# Patient Record
Sex: Female | Born: 1960
Health system: Southern US, Community
[De-identification: ages and names within clinical notes are randomized; demographics above are authoritative.]

## PROBLEM LIST (undated history)

## (undated) DIAGNOSIS — R87629 Unspecified abnormal cytological findings in specimens from vagina: Secondary | ICD-10-CM

## (undated) DIAGNOSIS — R928 Other abnormal and inconclusive findings on diagnostic imaging of breast: Secondary | ICD-10-CM

## (undated) DIAGNOSIS — H9190 Unspecified hearing loss, unspecified ear: Secondary | ICD-10-CM

## (undated) HISTORY — DX: Unspecified hearing loss, unspecified ear: H91.90

## (undated) HISTORY — DX: Other abnormal and inconclusive findings on diagnostic imaging of breast: R92.8

## (undated) HISTORY — PX: INNER EAR SURGERY: SHX679

## (undated) HISTORY — PX: TONSILLECTOMY: SUR1361

## (undated) HISTORY — DX: Unspecified abnormal cytological findings in specimens from vagina: R87.629

---

## 1998-11-18 ENCOUNTER — Other Ambulatory Visit: Admission: RE | Admit: 1998-11-18 | Discharge: 1998-11-18 | Payer: Self-pay | Admitting: *Deleted

## 1999-09-08 ENCOUNTER — Encounter: Payer: Self-pay | Admitting: *Deleted

## 1999-09-08 ENCOUNTER — Ambulatory Visit (HOSPITAL_COMMUNITY): Admission: RE | Admit: 1999-09-08 | Discharge: 1999-09-08 | Payer: Self-pay | Admitting: *Deleted

## 2000-09-21 ENCOUNTER — Encounter: Payer: Self-pay | Admitting: *Deleted

## 2000-09-21 ENCOUNTER — Ambulatory Visit (HOSPITAL_COMMUNITY): Admission: RE | Admit: 2000-09-21 | Discharge: 2000-09-21 | Payer: Self-pay | Admitting: *Deleted

## 2002-03-09 ENCOUNTER — Encounter: Admission: RE | Admit: 2002-03-09 | Discharge: 2002-03-09 | Payer: Self-pay | Admitting: Family Medicine

## 2002-03-16 ENCOUNTER — Encounter: Admission: RE | Admit: 2002-03-16 | Discharge: 2002-03-16 | Payer: Self-pay | Admitting: Family Medicine

## 2002-04-03 ENCOUNTER — Encounter: Payer: Self-pay | Admitting: *Deleted

## 2002-04-03 ENCOUNTER — Ambulatory Visit (HOSPITAL_COMMUNITY): Admission: RE | Admit: 2002-04-03 | Discharge: 2002-04-03 | Payer: Self-pay | Admitting: *Deleted

## 2003-05-22 ENCOUNTER — Ambulatory Visit (HOSPITAL_COMMUNITY): Admission: RE | Admit: 2003-05-22 | Discharge: 2003-05-22 | Payer: Self-pay | Admitting: *Deleted

## 2004-11-25 ENCOUNTER — Ambulatory Visit (HOSPITAL_COMMUNITY): Admission: RE | Admit: 2004-11-25 | Discharge: 2004-11-25 | Payer: Self-pay

## 2005-07-02 ENCOUNTER — Encounter (INDEPENDENT_AMBULATORY_CARE_PROVIDER_SITE_OTHER): Payer: Self-pay | Admitting: *Deleted

## 2005-07-17 ENCOUNTER — Ambulatory Visit: Payer: Self-pay | Admitting: Sports Medicine

## 2005-07-28 ENCOUNTER — Ambulatory Visit: Payer: Self-pay | Admitting: Sports Medicine

## 2006-01-14 ENCOUNTER — Ambulatory Visit (HOSPITAL_COMMUNITY): Admission: RE | Admit: 2006-01-14 | Discharge: 2006-01-14 | Payer: Self-pay | Admitting: Family Medicine

## 2006-07-01 DIAGNOSIS — F17200 Nicotine dependence, unspecified, uncomplicated: Secondary | ICD-10-CM

## 2006-07-01 DIAGNOSIS — F411 Generalized anxiety disorder: Secondary | ICD-10-CM | POA: Insufficient documentation

## 2006-07-02 ENCOUNTER — Encounter (INDEPENDENT_AMBULATORY_CARE_PROVIDER_SITE_OTHER): Payer: Self-pay | Admitting: *Deleted

## 2007-04-20 ENCOUNTER — Encounter: Payer: Self-pay | Admitting: *Deleted

## 2009-02-21 ENCOUNTER — Encounter: Payer: Self-pay | Admitting: Family Medicine

## 2009-02-21 ENCOUNTER — Ambulatory Visit: Payer: Self-pay | Admitting: Family Medicine

## 2009-02-21 DIAGNOSIS — M7120 Synovial cyst of popliteal space [Baker], unspecified knee: Secondary | ICD-10-CM | POA: Insufficient documentation

## 2009-02-21 DIAGNOSIS — N951 Menopausal and female climacteric states: Secondary | ICD-10-CM

## 2009-02-21 DIAGNOSIS — K429 Umbilical hernia without obstruction or gangrene: Secondary | ICD-10-CM | POA: Insufficient documentation

## 2009-02-21 LAB — CONVERTED CEMR LAB
BUN: 15 mg/dL (ref 6–23)
CO2: 24 meq/L (ref 19–32)
Calcium: 10.3 mg/dL (ref 8.4–10.5)
Chlamydia, DNA Probe: NEGATIVE
Chloride: 102 meq/L (ref 96–112)
Cholesterol: 217 mg/dL — ABNORMAL HIGH (ref 0–200)
Creatinine, Ser: 0.73 mg/dL (ref 0.40–1.20)
GC Probe Amp, Genital: NEGATIVE
Glucose, Bld: 103 mg/dL — ABNORMAL HIGH (ref 70–99)
HDL: 99 mg/dL (ref >39)
LDL Cholesterol: 96 mg/dL (ref 0–99)
Potassium: 4 meq/L (ref 3.5–5.3)
Sodium: 140 meq/L (ref 135–145)
Total CHOL/HDL Ratio: 2.2
Triglycerides: 109 mg/dL (ref <150)
VLDL: 22 mg/dL (ref 0–40)
Whiff Test: NEGATIVE

## 2009-03-01 ENCOUNTER — Telehealth: Payer: Self-pay | Admitting: Family Medicine

## 2009-03-03 ENCOUNTER — Encounter: Payer: Self-pay | Admitting: Family Medicine

## 2009-03-05 ENCOUNTER — Ambulatory Visit: Payer: Self-pay | Admitting: Family Medicine

## 2009-03-19 ENCOUNTER — Ambulatory Visit: Payer: Self-pay | Admitting: Psychology

## 2009-06-11 ENCOUNTER — Telehealth (INDEPENDENT_AMBULATORY_CARE_PROVIDER_SITE_OTHER): Payer: Self-pay | Admitting: *Deleted

## 2009-07-02 ENCOUNTER — Ambulatory Visit: Payer: Self-pay | Admitting: Family Medicine

## 2009-07-02 DIAGNOSIS — I10 Essential (primary) hypertension: Secondary | ICD-10-CM | POA: Insufficient documentation

## 2009-07-02 LAB — CONVERTED CEMR LAB: Hgb A1c MFr Bld: 5.7 %

## 2009-07-19 ENCOUNTER — Ambulatory Visit: Payer: Self-pay | Admitting: Family Medicine

## 2009-07-19 DIAGNOSIS — N390 Urinary tract infection, site not specified: Secondary | ICD-10-CM

## 2009-07-19 LAB — CONVERTED CEMR LAB
Nitrite: NEGATIVE
Specific Gravity, Urine: <1.005
pH: 6

## 2010-06-03 NOTE — Assessment & Plan Note (Signed)
Summary: uti per pt/neal/eo   Vital Signs:  Patient profile:   50 year old female Height:      66 inches Weight:      129 pounds BMI:     20.90 BSA:     1.66 Temp:     98.5 degrees F Pulse rate:   86 / minute BP sitting:   129 / 85  Vitals Entered By: Jone Baseman CMA (July 19, 2009 3:53 PM) CC: ? UTI Is Patient Diabetic? No Pain Assessment Patient in pain? no        Primary Care Provider:  Denny Levy MD  CC:  ? UTI.  History of Present Illness: UTI symptoms: started yesterday.  came in early because got very sick in past with UTI.  noted dysuria particularly at end of urinating.  also frequency and urgency. no blood.  no flank pain.  no fevers.   Habits & Providers  Alcohol-Tobacco-Diet     Tobacco Status: current     Tobacco Counseling: to quit use of tobacco products     Cigarette Packs/Day: 1.0  Current Medications (verified): 1)  Multivitamins  Tabs (Multiple Vitamin) .... One By Mouth Daily 2)  Lisinopril 20 Mg Tabs (Lisinopril) .Marland Kitchen.. 1 Tablet By Mouth Daily 3)  Wellbutrin 100 Mg Tabs (Bupropion Hcl) .... One By Mouth Two Times A Day X 3 Days, Then One By Mouth Three Times A Day 4)  Cephalexin 500 Mg Caps (Cephalexin) .Marland Kitchen.. 1 By Mouth Two Times A Day For 7 Days.  Allergies (verified): 1)  ! Sulfa  Review of Systems       per HPI  Physical Exam  General:  Well-developed,well-nourished,in no acute distress; alert,appropriate and cooperative throughout examination VS noted Abdomen:  soft, nontender, nondistended.  no CVA tenderness.     Impression & Recommendations:  Problem # 1:  UTI (ICD-599.0) Assessment New  UA findings + symptoms.  will rx with keflex.  told to return if not improving in 5 days of if develops fever/chills/flank pain.    Her updated medication list for this problem includes:    Cephalexin 500 Mg Caps (Cephalexin) .Marland Kitchen... 1 by mouth two times a day for 7 days.  Orders: [facility]- Est Level  3 ([address])  Complete Medication  List: 1)  Multivitamins Tabs (Multiple vitamin) .... One by mouth daily 2)  Lisinopril 20 Mg Tabs (Lisinopril) .Marland Kitchen.. 1 tablet by mouth daily 3)  Wellbutrin 100 Mg Tabs (Bupropion hcl) .... One by mouth two times a day x 3 days, then one by mouth three times a day 4)  Cephalexin 500 Mg Caps (Cephalexin) .Marland Kitchen.. 1 by mouth two times a day for 7 days.  Other Orders: Urinalysis-[facility] (00000) Prescriptions: CEPHALEXIN 500 MG CAPS (CEPHALEXIN) 1 by mouth two times a day for 7 days.  #14 x 0   Entered and Authorized by:   Ancil Boozer  MD   Signed by:   Ancil Boozer  MD on 07/19/2009   Method used:   Print then Give to Patient   RxID:   [id number]   Laboratory Results   Urine Tests  Date/Time Received: July 19, 2009 3:42 PM  Date/Time Reported: July 19, 2009 4:20 PM   Routine Urinalysis   Color: yellow Appearance: Clear Glucose: negative   (Normal Range: Negative) Bilirubin: negative   (Normal Range: Negative) Ketone: negative   (Normal Range: Negative) Spec. Gravity: <1.005   (Normal Range: 1.003-1.035) Blood: small   (Normal Range: Negative) pH: 6.0   (Normal  Range: 5.0-8.0) Protein: negative   (Normal Range: Negative) Urobilinogen: 0.2   (Normal Range: 0-1) Nitrite: negative   (Normal Range: Negative) Leukocyte Esterace: trace   (Normal Range: Negative)  Urine Microscopic WBC/HPF: 0-3 RBC/HPF: 0-2 Bacteria/HPF: 2+ Epithelial/HPF: 0-3    Comments: ...........test performed by...........Marland KitchenTerese Door, CMA       Prevention & Chronic Care Immunizations   Influenza vaccine: Not documented    Tetanus booster: Not documented    Pneumococcal vaccine: Not documented  Colorectal Screening   Hemoccult: Not documented   Hemoccult action/deferral: Not indicated  (07/02/2009)    Colonoscopy: Not documented  Other Screening   Pap smear: NEGATIVE FOR INTRAEPITHELIAL LESIONS OR MALIGNANCY.  (02/21/2009)    Mammogram: Done.  (01/02/2006)   Smoking status:  current  (07/19/2009)   Smoking cessation counseling: yes  (02/21/2009)  Lipids   Total Cholesterol: 217  (02/21/2009)   LDL: 96  (02/21/2009)   LDL Direct: Not documented   HDL: 99  (02/21/2009)   Triglycerides: 109  (02/21/2009)  Hypertension   Last Blood Pressure: 129 / 85  (07/19/2009)   Serum creatinine: 0.73  (02/21/2009)   Serum potassium 4.0  (02/21/2009)    Hypertension flowsheet reviewed?: Yes   Progress toward BP goal: At goal  Self-Management Support :   Personal Goals (by the next clinic visit) :      Personal blood pressure goal: 140/90  (07/02/2009)   Hypertension self-management support: Written self-care plan  (07/02/2009)

## 2010-06-03 NOTE — Assessment & Plan Note (Signed)
Summary: bp ck,tcb  Nurse Visit    patient in office today for BP check. she states she found out the name of the ear drops that were prescribed for her in the past, Floxin ear drops. she was told MD would prescribe . [facility]. also reports left ear is draining. she is allergic to sulfa.     BP check today. 148/92 right arm and 140/96 left arm. pulse 88. she has checked BP at home once and reading was 133/102. phone number to call back [phone]. will forward message to MD. Theresia Lo RN  March 05, 2009 10:54 AM  Please call patient to let her know that I am prescribing 2 medications. (1) the ear drop, and (2) something for her BP. I would like for her to make a follow up visit in 2-4 weeks to check her blood pressure and get an A1c. Also, ask her to continue to check her BP at home and remind her to sign up for the FREE smoking cessation class. Thanks.   Prescriptions: Prescriptions: OFLOXACIN 0.3 % SOLN (OFLOXACIN) 10 gtt in ear(s) q d x 14 days  #1 qs x 0   Entered and Authorized by:   Helane Rima DO   Signed by:   Helane Rima DO on 03/05/2009   Method used:   Electronically to        [facility] # [phone]* (retail)       [address]       [place], [place]  [address]       Ph: [phone]       Fax: [phone]   RxID:   [id number] LISINOPRIL 20 MG TABS (LISINOPRIL) 1 tablet by mouth daily  #90 x 3   Entered and Authorized by:   Helane Rima DO   Signed by:   Helane Rima DO on 03/05/2009   Method used:   Electronically to        [facility] # [phone]* (retail)       [address]       [place], [place]  [address]       Ph: [phone]       Fax: [phone]   RxID:   [id number]   Appended Document: bp ck,tcb patient notified of above message .

## 2010-06-03 NOTE — Progress Notes (Signed)
Summary: phn msg  Phone Note Call from Patient Call back at [facility] Phone [phone]   Caller: Patient Summary of Call: Pt needs to know if she needs to have any labs done soon? Initial call taken by: Clydell Hakim,  June 11, 2009 12:16 PM  Follow-up for Phone Call        to PCP Follow-up by: Gladstone Pih,  June 11, 2009 2:42 PM  Additional Follow-up for Phone Call Additional follow up Details #1::        will send to Dr Earlene Plater as Fredia Beets never seen her  Denny Levy MD  June 11, 2009 2:44 PM   New Problems: HYPERGLYCEMIA, FASTING (ICD-790.29)   Additional Follow-up for Phone Call Additional follow up Details #2::    She needs an A1c that has already been ordered. Please check her BP when she comes in or just have her make this an office visit. Follow-up by: Helane Rima DO,  June 12, 2009 7:23 AM  Additional Follow-up for Phone Call Additional follow up Details #3:: Details for Additional Follow-up Action Taken: Pt informed and agreeablet o making appt. Additional Follow-up by: Jone Baseman CMA,  June 12, 2009 4:40 PM  New Problems: HYPERGLYCEMIA, FASTING (ICD-790.29)  Appended Document: phn msg Pt made appt for 07/02/09 with Dr. Earlene Plater.

## 2010-06-03 NOTE — Letter (Signed)
Summary: Results Letter  [facility]  [address]   [place], [place] [address]   Phone: [phone]  Fax: [phone]    03/03/2009  Michelle Nielsen [address] [place], [place]  [address]  Dear Michelle Nielsen:  I'm happy to inform you that the results of your HIV, gonorrhea, and chlamydia tests were all negative. Your kidney and electrolytes were also normal. Your cholesterol was slightly elevated at 217 (see results below). I have provided some diet tips to help bring this number down.   Your glucose was also slightly elevated. This could be high if you had something to eat or drink before the labs were drawn, but it may be an indicator of early diabetes. We can check this again next time you come in.   We have carefully reviewed your last lipid profile from 02/21/2009 and the results are noted below with a summary of recommendations for lipid management.    Cholesterol:       217     Goal: < 200   HDL "good" Cholesterol:   99     Goal: > 39   LDL "bad" Cholesterol:   96     Goal: < 99   Triglycerides:       109     Goal: < 150    TLC Diet (Therapeutic Lifestyle Change): Saturated Fats < 7% of total calories ***Reduce Saturated Fats Polyunstaurated Fat can be up to 10% of total calories Monounsaturated Fat Fat can be up to 20% of total calories Total Fat should be no greater than 25-35% of total calories Carbohydrates should be 50-60% of total calories Protein should be approximately 15% of total calories Fiber should be at least 20-30 grams a day ***Increased fiber may help lower LDL Total Cholesterol should be < 200mg /day Consider adding plant stanol/sterols to diet (example: Benacol spread) ***A higher intake of unsaturated fat may reduce Triglycerides and Increase HDL    Adjunctive Measures (may lower LIPIDS and reduce risk of Heart Attack) include: Aerobic Exercise (20-30 minutes 3-4 times a week) Limit Alcohol Consumption Weight Reduction Aspirin 75-81 mg a  day by mouth (if not allergic or contraindicated) Dietary Fiber 20-30 grams a day by mouth     Current Medications: 1)    Multivitamins  Tabs (Multiple vitamin) .... One by mouth daily  If you have any questions, please call. We appreciate being able to work with you.   Sincerely,    [facility] Helane Rima DO  Appended Document: Results Letter mailed.
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
## 2010-06-03 NOTE — Progress Notes (Signed)
Summary: Lab Res  Phone Note Call from Patient   Caller: Patient Summary of Call: pt checking on results of all labs from physical. Initial call taken by: Clydell Hakim,  March 01, 2009 3:04 PM  Follow-up for Phone Call        will forward to MD. Follow-up by: Theresia Lo RN,  March 01, 2009 6:42 PM  Additional Follow-up for Phone Call Additional follow up Details #1::        letter complete. please call patient with results. Additional Follow-up by: Helane Rima DO,  March 03, 2009 3:09 PM     Appended Document: Lab Res also, please ask the patient if she has kept a record of her blood pressure readings. if she has not, please have her come in for a nurse visit, bp check. thanks.

## 2010-06-03 NOTE — Assessment & Plan Note (Signed)
Summary: hyperglycemia, HTN, tobacco abuse   Vital Signs:  Patient profile:   50 year old female Height:      66 inches Weight:      128.6 pounds BMI:     20.83 Temp:     98.2 degrees F oral Pulse rate:   88 / minute BP sitting:   116 / 75  (left arm) Cuff size:   regular  Vitals Entered By: Gladstone Pih (July 02, 2009 4:40 PM) CC: Hyperglycemia, HTN, Tobacco Abuse Is Patient Diabetic? Yes Did you bring your meter with you today? Yes Pain Assessment Patient in pain? no        Primary Care Provider:  Denny Levy MD  CC:  Hyperglycemia, HTN, and Tobacco Abuse.  History of Present Illness:  1. Fasting Hyperglycemia: On recent BMP. denies polyuria, polydipsia, weight change, Fam Hx DM.  2. HTN: Rx Lisinopril. No issues with the medication.   3. Tobaco Abuse: Went to Smoking Cessation Class with Dr. Pascal Lux. Wants to try Wellbutrin.  Habits & Providers  Alcohol-Tobacco-Diet     Tobacco Status: current     Tobacco Counseling: to quit use of tobacco products     Cigarette Packs/Day: 1.0  Current Medications (verified): 1)  Multivitamins  Tabs (Multiple Vitamin) .... One By Mouth Daily 2)  Lisinopril 20 Mg Tabs (Lisinopril) .Marland Kitchen.. 1 Tablet By Mouth Daily 3)  Wellbutrin 100 Mg Tabs (Bupropion Hcl) .... One By Mouth Two Times A Day X 3 Days, Then One By Mouth Three Times A Day  Allergies (verified): 1)  ! 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 CV:  Denies chest pain or discomfort, shortness of breath with exertion, swelling of feet, and swelling of hands. Resp:  Denies shortness of breath. Endo:  Denies  excessive hunger, excessive thirst, excessive urination, and weight change.  Physical Exam  General:  Well-developed, well-nourished, in no acute distress; alert, appropriate and cooperative throughout examination. Vitals reviewed. Lungs:  Normal respiratory effort, chest expands symmetrically. Lungs are clear to auscultation, no crackles or wheezes. Heart:  Normal rate and regular rhythm. S1 and S2 normal without gallop, murmur, click, rub or other extra sounds. Pulses:  R and L dorsalis pedis, and posterior tibial pulses are full and equal bilaterally. Extremities:  No clubbing, cyanosis, edema, or deformity noted. Psych:  Oriented X3, memory intact for recent and remote, normally interactive, and slightly anxious.     Impression & Recommendations:  Problem # 1:  HYPERGLYCEMIA, FASTING (ICD-790.29) Assessment Unchanged A1c WNL. Orders: A1C-[facility] (45409) [facility]- Est  Level 4 (81191)  Problem # 2:  ESSENTIAL HYPERTENSION (ICD-401.9) Assessment: Improved Controlled on current medication. Will need follow up BMP to monitor creatinine. Her updated medication list for this problem includes:    Lisinopril 20 Mg Tabs (Lisinopril) .Marland Kitchen... 1 tablet by mouth daily  Orders: [facility]- Est  Level 4 (47829)  Problem # 3:  TOBACCO DEPENDENCE (ICD-305.1) Assessment: Unchanged Rx Wellbutrin. Patient will call to look into continuing with the free class. Orders: [facility]- Est  Level 4 (56213)  Complete Medication List: 1)  Multivitamins Tabs (Multiple vitamin) .... One by mouth daily 2)  Lisinopril 20 Mg Tabs (Lisinopril) .Marland Kitchen.. 1 tablet by mouth daily 3)  Wellbutrin 100 Mg Tabs (Bupropion hcl) .... One by mouth two times a day x 3 days, then one by mouth three times a day  Patient Instructions: 1)  It was nice to see you today! 2)  Start Wellbutrin for your depression and to help with smoking cessation.  3)  Follow up in 1 month or sooner if you need me. We will check labs when you come back. 4)  You are  due for a screening mammogram and colonoscopy. Prescriptions: LISINOPRIL 20 MG TABS (LISINOPRIL) 1 tablet by mouth daily  #90 x 3   Entered and Authorized by:   Helane Rima DO   Signed by:   Helane Rima DO on 07/02/2009   Method used:   Print then Give to Patient   RxID:   [id number] WELLBUTRIN 100 MG TABS (BUPROPION HCL) one by mouth two times a day x 3 days, then one by mouth three times a day  #90 x 3   Entered and Authorized by:   Helane Rima DO   Signed by:   Helane Rima DO on 07/02/2009   Method used:   Print then Give to Patient   RxID:   [id number]   Laboratory Results   Blood Tests   Date/Time Received: July 02, 2009 4:48 PM  Date/Time Reported: July 02, 2009 4:59 PM   HGBA1C: 5.7%   (Normal Range: Non-Diabetic - 3-6%   Control Diabetic - 6-8%)  Comments: ...............test performed by......Marland KitchenBonnie A. [place], MLS (ASCP)cm      Prevention & Chronic Care Immunizations   Influenza vaccine: Not documented    Tetanus booster: Not documented    Pneumococcal vaccine: Not documented  Colorectal Screening   Hemoccult: Not documented   Hemoccult action/deferral: Not indicated  (07/02/2009)    Colonoscopy: Not documented  Other Screening   Pap smear: NEGATIVE FOR INTRAEPITHELIAL LESIONS OR MALIGNANCY.  (02/21/2009)    Mammogram: Done.  (01/02/2006)   Smoking status: current  (07/02/2009)   Smoking cessation counseling: yes  (02/21/2009)  Lipids   Total Cholesterol: 217  (02/21/2009)   LDL: 96  (02/21/2009)   LDL Direct: Not documented   HDL: 99  (02/21/2009)   Triglycerides: 109  (02/21/2009)  Hypertension   Last Blood Pressure: 116 / 75  (07/02/2009)   Serum creatinine: 0.73  (02/21/2009)   Serum potassium 4.0  (02/21/2009)    Hypertension flowsheet reviewed?: Yes   Progress toward BP goal: At goal  Self-Management Support :   Personal Goals (by the next clinic visit) :      Personal blood pressure goal: 140/90   (07/02/2009)   Patient will work on the following items until the next clinic visit to reach self-care goals:     Medications and monitoring: take my medicines every day, bring all of my medications to every visit  (07/02/2009)     Eating: drink diet soda or water instead of juice or soda, eat more vegetables, use fresh or frozen vegetables, eat foods that are low in salt, eat baked foods instead of fried foods, eat fruit for snacks and desserts, limit or avoid alcohol  (07/02/2009)     Activity: take a 30 minute walk every day, take the stairs instead of the elevator, park at the far end of the parking lot  (07/02/2009)    Hypertension self-management support: Written self-care plan  (07/02/2009)   Hypertension self-care plan printed.

## 2010-06-12 ENCOUNTER — Encounter: Payer: Self-pay | Admitting: *Deleted

## 2010-08-04 ENCOUNTER — Encounter: Payer: Self-pay | Admitting: Family Medicine

## 2015-01-03 ENCOUNTER — Emergency Department
Admission: EM | Admit: 2015-01-03 | Discharge: 2015-01-03 | Disposition: A | Discharge status: Home / Self Care | Payer: Self-pay | Attending: Emergency Medicine | Admitting: Emergency Medicine

## 2015-01-03 ENCOUNTER — Encounter: Payer: Self-pay | Admitting: *Deleted

## 2015-01-03 DIAGNOSIS — I10 Essential (primary) hypertension: Secondary | ICD-10-CM | POA: Insufficient documentation

## 2015-01-03 DIAGNOSIS — Z79811 Long term (current) use of aromatase inhibitors: Secondary | ICD-10-CM | POA: Insufficient documentation

## 2015-01-03 DIAGNOSIS — Z792 Long term (current) use of antibiotics: Secondary | ICD-10-CM | POA: Insufficient documentation

## 2015-01-03 DIAGNOSIS — Z720 Tobacco use: Secondary | ICD-10-CM | POA: Insufficient documentation

## 2015-01-03 DIAGNOSIS — Z79899 Other long term (current) drug therapy: Secondary | ICD-10-CM | POA: Insufficient documentation

## 2015-01-03 DIAGNOSIS — F1010 Alcohol abuse, uncomplicated: Secondary | ICD-10-CM | POA: Insufficient documentation

## 2015-01-03 DIAGNOSIS — R000 Tachycardia, unspecified: Secondary | ICD-10-CM | POA: Insufficient documentation

## 2015-01-03 NOTE — ED Provider Notes (Signed)
[facility] Emergency Department Provider Note  ____________________________________________  Time seen: 10:30 PM  I have reviewed the triage vital signs and the nursing notes.   HISTORY  Chief Complaint Alcohol Problem    HPI Michelle Nielsen is a 54 y.o. female who presents for medical screening exam prior to entering a alcohol detox and rehabilitation program. He states that she drinks wine 2-3 times a week, and when she does she drinks "a lot". Denies any withdrawal symptoms such as anxiousness flushing or tremor. No history of hallucinosis or seizure. No recent trauma. No other acute symptoms.     History reviewed. No pertinent past medical history.   Patient Active Problem List   Diagnosis Date Noted  . UTI 07/19/2009  . ESSENTIAL HYPERTENSION 07/02/2009  . UMBILICAL HERNIA 88/91/6945  . MENOPAUSE-RELATED VASOMOTOR SYMPTOMS, HOT FLASHES 02/21/2009  . BAKER'S CYST, RIGHT KNEE 02/21/2009  . ANXIETY 07/01/2006  . TOBACCO DEPENDENCE 07/01/2006     History reviewed. No pertinent past surgical history.   Current Outpatient Rx  Name  Route  Sig  Dispense  Refill  . buPROPion (WELLBUTRIN) 100 MG tablet   Oral   Take 100 mg by mouth 3 (three) times daily.           . cephALEXin (KEFLEX) 500 MG capsule   Oral   Take 500 mg by mouth 2 (two) times daily. X 7 days          . lisinopril (PRINIVIL,ZESTRIL) 20 MG tablet   Oral   Take 20 mg by mouth daily.           . Multiple Vitamin (MULTIVITAMIN) tablet   Oral   Take 1 tablet by mouth daily.              Allergies Sulfonamide derivatives   No family history on file.  Social History Social History  Substance Use Topics  . Smoking status: Current Every Day Smoker  . Smokeless tobacco: None  . Alcohol Use: Yes    Review of Systems  Constitutional:   No fever or chills. No weight changes Eyes:   No blurry vision or double vision.  ENT:   No sore throat. Cardiovascular:    No chest pain. Respiratory:   No dyspnea or cough. Gastrointestinal:   Negative for abdominal pain, vomiting and diarrhea.  No BRBPR or melena. Genitourinary:   Negative for dysuria, urinary retention, bloody urine, or difficulty urinating. Musculoskeletal:   Negative for back pain. No joint swelling or pain. Skin:   Negative for rash. Neurological:   Negative for headaches, focal weakness or numbness. Psychiatric:  No anxiety or depression.   Endocrine:  No hot/cold intolerance, changes in energy, or sleep difficulty.  10-point ROS otherwise negative.  ____________________________________________   PHYSICAL EXAM:  VITAL SIGNS: ED Triage Vitals  Enc Vitals Group     BP 01/03/15 2154 125/92 mmHg     Pulse Rate 01/03/15 2154 109     Resp 01/03/15 2154 20     Temp 01/03/15 2154 98.3 F (36.8 C)     Temp Source 01/03/15 2154 Oral     SpO2 01/03/15 2154 99 %     Weight 01/03/15 2154 130 lb (58.968 kg)     Height 01/03/15 2154 5\' 7"  (1.702 m)     Head Cir --      Peak Flow --      Pain Score --      Pain Loc --  Pain Edu? --      Excl. in [place]? --      Constitutional:   Alert and oriented. Well appearing and in no distress. Eyes:   No scleral icterus. No conjunctival pallor. PERRL. EOMI ENT   Head:   Normocephalic and atraumatic.   Nose:   No congestion/rhinnorhea. No septal hematoma   Mouth/Throat:   MMM, no pharyngeal erythema. No peritonsillar mass. No uvula shift.   Neck:   No stridor. No SubQ emphysema. No meningismus. Hematological/Lymphatic/Immunilogical:   No cervical lymphadenopathy. Cardiovascular:   RRR. Normal and symmetric distal pulses are present in all extremities. No murmurs, rubs, or gallops. Respiratory:   Normal respiratory effort without tachypnea nor retractions. Breath sounds are clear and equal bilaterally. No wheezes/rales/rhonchi. Gastrointestinal:   Soft and nontender. No distention. There is no CVA tenderness.  No rebound,  rigidity, or guarding. Genitourinary:   deferred Musculoskeletal:   Nontender with normal range of motion in all extremities. No joint effusions.  No lower extremity tenderness.  No edema. Neurologic:   Normal speech and language.  CN 2-10 normal. Motor grossly intact. No pronator drift.  Normal gait. No gross focal neurologic deficits are appreciated.  Skin:    Skin is warm, dry and intact. No rash noted.  No petechiae, purpura, or bullae. Psychiatric:   Mood and affect are normal. Speech and behavior are normal. Patient exhibits appropriate insight and judgment.  ____________________________________________    LABS (pertinent positives/negatives) (all labs ordered are listed, but only abnormal results are displayed) Labs Reviewed - No data to display ____________________________________________   EKG    ____________________________________________    RADIOLOGY    ____________________________________________   PROCEDURES   ____________________________________________   INITIAL IMPRESSION / ASSESSMENT AND PLAN / ED COURSE  Pertinent labs & imaging results that were available during my care of the patient were reviewed by me and considered in my medical decision making (see chart for details).  No significant acute symptoms. Exam is reassuring. Patient is somewhat evasive with questioning but it appears to be because she is highly motivated to go to detox and does not want to be detained in the ED for extensive workup for psychiatric evaluation. Tried to reassure her that we do not have any intention of doing that. No evidence of withdrawal at this time. Low suspicion for sepsis or PE. I think the tachycardia is incidental, and there is no evidence of significant dehydration either. At this point she is medically clear and stable for discharge to follow up with her outpatient detox program. Her social worker is coordinating her referral to [facility]  detox.     ____________________________________________   FINAL CLINICAL IMPRESSION(S) / ED DIAGNOSES  Final diagnoses:  Alcohol abuse   nonspecific tachycardia    Carrie Mew, MD 01/03/15 2304

## 2015-01-03 NOTE — ED Notes (Signed)
Patient with no complaints at this time. Respirations even and unlabored. Skin warm/dry. Discharge instructions reviewed with patient at this time. Patient given opportunity to voice concerns/ask questions. Patient discharged at this time and left Emergency Department with steady gait.   

## 2015-01-03 NOTE — [facility] Counselor (Signed)
Pre-screen faxed to [facility].

## 2015-01-03 NOTE — ED Notes (Signed)
Pt brought in by daughter.  Pt requesting alcohol detox.  Denies drug use.  Denies SI or HI   Pt alert and cooperative

## 2015-01-03 NOTE — Discharge Instructions (Signed)
You were seen in the ER today for medical clearance for alcohol detox and rehabilitation. You do not have any concerning symptoms, and your physical exam was unremarkable. There does not appear to be any withdrawal or other issues at this time. You are clear for referral to detox.  Alcohol Use Disorder Alcohol use disorder is a mental disorder. It is not a one-time incident of heavy drinking. Alcohol use disorder is the excessive and uncontrollable use of alcohol over time that leads to problems with functioning in one or more areas of daily living. People with this disorder risk harming themselves and others when they drink to excess. Alcohol use disorder also can cause other mental disorders, such as mood and anxiety disorders, and serious physical problems. People with alcohol use disorder often misuse other drugs.  Alcohol use disorder is common and widespread. Some people with this disorder drink alcohol to cope with or escape from negative life events. Others drink to relieve chronic pain or symptoms of mental illness. People with a family history of alcohol use disorder are at higher risk of losing control and using alcohol to excess.  SYMPTOMS  Signs and symptoms of alcohol use disorder may include the following:   Consumption ofalcohol inlarger amounts or over a longer period of time than intended.  Multiple unsuccessful attempts to cutdown or control alcohol use.   A great deal of time spent obtaining alcohol, using alcohol, or recovering from the effects of alcohol (hangover).  A strong desire or urge to use alcohol (cravings).   Continued use of alcohol despite problems at work, school, or home because of alcohol use.   Continued use of alcohol despite problems in relationships because of alcohol use.  Continued use of alcohol in situations when it is physically hazardous, such as driving a car.  Continued use of alcohol despite awareness of a physical or psychological problem  that is likely related to alcohol use. Physical problems related to alcohol use can involve the brain, heart, liver, stomach, and intestines. Psychological problems related to alcohol use include intoxication, depression, anxiety, psychosis, delirium, and dementia.   The need for increased amounts of alcohol to achieve the same desired effect, or a decreased effect from the consumption of the same amount of alcohol (tolerance).  Withdrawal symptoms upon reducing or stopping alcohol use, or alcohol use to reduce or avoid withdrawal symptoms. Withdrawal symptoms include:  Racing heart.  Hand tremor.  Difficulty sleeping.  Nausea.  Vomiting.  Hallucinations.  Restlessness.  Seizures. DIAGNOSIS Alcohol use disorder is diagnosed through an assessment by your health care provider. Your health care provider may start by asking three or four questions to screen for excessive or problematic alcohol use. To confirm a diagnosis of alcohol use disorder, at least two symptoms must be present within a 26-month period. The severity of alcohol use disorder depends on the number of symptoms:  Mild--two or three.  Moderate--four or five.  Severe--six or more. Your health care provider may perform a physical exam or use results from lab tests to see if you have physical problems resulting from alcohol use. Your health care provider may refer you to a mental health professional for evaluation. TREATMENT  Some people with alcohol use disorder are able to reduce their alcohol use to low-risk levels. Some people with alcohol use disorder need to quit drinking alcohol. When necessary, mental health professionals with specialized training in substance use treatment can help. Your health care provider can help you decide how severe  your alcohol use disorder is and what type of treatment you need. The following forms of treatment are available:   Detoxification. Detoxification involves the use of prescription  medicines to prevent alcohol withdrawal symptoms in the first week after quitting. This is important for people with a history of symptoms of withdrawal and for heavy drinkers who are likely to have withdrawal symptoms. Alcohol withdrawal can be dangerous and, in severe cases, cause death. Detoxification is usually provided in a hospital or in-patient substance use treatment facility.  Counseling or talk therapy. Talk therapy is provided by substance use treatment counselors. It addresses the reasons people use alcohol and ways to keep them from drinking again. The goals of talk therapy are to help people with alcohol use disorder find healthy activities and ways to cope with life stress, to identify and avoid triggers for alcohol use, and to handle cravings, which can cause relapse.  Medicines.Different medicines can help treat alcohol use disorder through the following actions:  Decrease alcohol cravings.  Decrease the positive reward response felt from alcohol use.  Produce an uncomfortable physical reaction when alcohol is used (aversion therapy).  Support groups. Support groups are run by people who have quit drinking. They provide emotional support, advice, and guidance. These forms of treatment are often combined. Some people with alcohol use disorder benefit from intensive combination treatment provided by specialized substance use treatment centers. Both inpatient and outpatient treatment programs are available. Document Released: 05/28/2004 Document Revised: 09/04/2013 Document Reviewed: 07/28/2012 [facility] Patient Information 2015 [place], [place]. This information is not intended to replace advice given to you by your health care provider. Make sure you discuss any questions you have with your health care provider.

## 2015-05-05 HISTORY — PX: LEEP: SHX91

## 2015-05-05 HISTORY — PX: BREAST BIOPSY: SHX20

## 2015-09-18 ENCOUNTER — Encounter (HOSPITAL_COMMUNITY): Payer: Self-pay | Admitting: *Deleted

## 2015-09-25 ENCOUNTER — Other Ambulatory Visit: Payer: Self-pay

## 2015-09-25 ENCOUNTER — Other Ambulatory Visit: Payer: Self-pay | Admitting: Obstetrics and Gynecology

## 2015-09-25 DIAGNOSIS — Z1231 Encounter for screening mammogram for malignant neoplasm of breast: Secondary | ICD-10-CM

## 2015-10-10 ENCOUNTER — Encounter (HOSPITAL_COMMUNITY): Payer: Self-pay

## 2015-10-10 ENCOUNTER — Ambulatory Visit
Admission: RE | Admit: 2015-10-10 | Discharge: 2015-10-10 | Disposition: A | Discharge status: Home / Self Care | Payer: No Typology Code available for payment source | Source: Ambulatory Visit | Attending: Obstetrics and Gynecology | Admitting: Obstetrics and Gynecology

## 2015-10-10 ENCOUNTER — Ambulatory Visit (HOSPITAL_COMMUNITY)
Admission: RE | Admit: 2015-10-10 | Discharge: 2015-10-10 | Disposition: A | Discharge status: Home / Self Care | Payer: Medicaid Other | Source: Ambulatory Visit | Attending: Obstetrics and Gynecology | Admitting: Obstetrics and Gynecology

## 2015-10-10 VITALS — BP 126/82 | Temp 98.5°F | Ht 67.0 in | Wt 133.2 lb

## 2015-10-10 DIAGNOSIS — A630 Anogenital (venereal) warts: Secondary | ICD-10-CM | POA: Insufficient documentation

## 2015-10-10 DIAGNOSIS — Z1239 Encounter for other screening for malignant neoplasm of breast: Secondary | ICD-10-CM

## 2015-10-10 DIAGNOSIS — R87810 Cervical high risk human papillomavirus (HPV) DNA test positive: Secondary | ICD-10-CM

## 2015-10-10 DIAGNOSIS — Z1231 Encounter for screening mammogram for malignant neoplasm of breast: Secondary | ICD-10-CM

## 2015-10-10 DIAGNOSIS — R87610 Atypical squamous cells of undetermined significance on cytologic smear of cervix (ASC-US): Secondary | ICD-10-CM

## 2015-10-10 NOTE — Addendum Note (Signed)
Encounter addended by: Loletta Parish, RN on: 10/10/2015  2:45 PM<BR>     Documentation filed: ED Follow-Up

## 2015-10-10 NOTE — Patient Instructions (Addendum)
Educational materials on self breast awareness given. Explained to Michelle Nielsen the colposcopy the needed follow up for her abnormal Pap smear on 08/13/2015. Referred patient to the [name] for colposcopy. Appointment scheduled for Monday, October 14, 2015 at 1530. Referred patient to the [name] for a screening mammogram. Appointment scheduled for Thursday, October 10, 2015 at 1330. Let [facility] know the [facility] will follow up with her within the next couple of weeks with results to mammogram by letter or phone. [name] verbalized understanding.  Jazz Biddy, Arvil Chaco, RN 1:38 PM

## 2015-10-10 NOTE — Progress Notes (Signed)
Patient referred to [facility] by [facility] due to having an abnormal Pap smear on 08/13/2015 that a colposcopy is recommended for follow-up.   Pap Smear: Pap smear not completed today. Last Pap smear was 08/13/2015 at [facility] and ASCUS with positive HPV. Referred patient to the [facility] for colposcopy. Appointment scheduled for Monday, October 14, 2015 at 1530. Per patient has no history of a an abnormal Pap smear prior to her most recent Pap smear. Last Pap smear result is in EPIC.  Physical exam: Breasts Breasts symmetrical. No skin abnormalities bilateral breasts. No nipple retraction bilateral breasts. No nipple discharge bilateral breasts. No lymphadenopathy. No lumps palpated bilateral breasts. No complaints of pain or tenderness on exam. RReferred patient to the [facility] for a screening mammogram. Appointment scheduled for Thursday, October 10, 2015 at 1330.  Pelvic/Bimanual No Pap smear completed today since last Pap smear was 08/13/2015. Pap smear not indicated per BCCCP guidelines.   Smoking History: Patient has never smoked.  Patient Navigation: Patient education provided. Access to services provided for patient through [facility] program.   Colorectal Cancer Screening: Patient has never had a colonoscopy. No complaints today.

## 2015-10-10 NOTE — Addendum Note (Signed)
Encounter addended by: Loletta Parish, RN on: 10/10/2015  2:44 PM<BR>     Documentation filed: Patient Instructions Section

## 2015-10-14 ENCOUNTER — Encounter: Payer: Self-pay | Admitting: General Practice

## 2015-10-14 ENCOUNTER — Ambulatory Visit (INDEPENDENT_AMBULATORY_CARE_PROVIDER_SITE_OTHER): Payer: Medicaid Other | Admitting: Family Medicine

## 2015-10-14 ENCOUNTER — Other Ambulatory Visit (HOSPITAL_COMMUNITY)
Admission: RE | Admit: 2015-10-14 | Discharge: 2015-10-14 | Disposition: A | Discharge status: Home / Self Care | Payer: Medicaid Other | Source: Ambulatory Visit | Attending: Family Medicine | Admitting: Family Medicine

## 2015-10-14 ENCOUNTER — Encounter: Payer: Self-pay | Admitting: Family Medicine

## 2015-10-14 VITALS — BP 114/72 | HR 96 | Ht 67.0 in | Wt 128.0 lb

## 2015-10-14 DIAGNOSIS — R87610 Atypical squamous cells of undetermined significance on cytologic smear of cervix (ASC-US): Secondary | ICD-10-CM | POA: Diagnosis not present

## 2015-10-14 DIAGNOSIS — R896 Abnormal cytological findings in specimens from other organs, systems and tissues: Secondary | ICD-10-CM | POA: Insufficient documentation

## 2015-10-14 DIAGNOSIS — Z3202 Encounter for pregnancy test, result negative: Secondary | ICD-10-CM

## 2015-10-14 DIAGNOSIS — IMO0002 Reserved for concepts with insufficient information to code with codable children: Secondary | ICD-10-CM | POA: Insufficient documentation

## 2015-10-14 LAB — POCT PREGNANCY, URINE: Preg Test, Ur: NEGATIVE

## 2015-10-14 NOTE — Progress Notes (Signed)
Patient ID: Michelle Nielsen, female   DOB: 1960/07/15, 55 y.o.   MRN: [id number]    [place] COLPOSCOPY PROCEDURE NOTE  55 y.o. [id number] here for colposcopy for ASCUS with POSITIVE high risk HPV pap smear on 08/13/2015. Discussed role for HPV in cervical dysplasia, need for surveillance.  Patient given informed consent, signed copy in the chart, time out was performed.  Placed in lithotomy position. Cervix viewed with speculum and colposcope after application of acetic acid.   Colposcopy adequate? No- dense white changes extend into the canal. Visible lesion(s) at 6 o'clock, with dense white changes on the inferior edge of the cervical os and punctate lesion at 3 o'clock; corresponding biopsies obtained.  ECC specimen obtained. All specimens were labeled and sent to pathology.  Patient was given post procedure instructions.  Will follow up pathology and manage accordingly.  Routine preventative health maintenance measures emphasized.   Caren Macadam, MD , MPH, ABFM Family Medicine, OB Fellow [facility]

## 2015-10-14 NOTE — Patient Instructions (Signed)
Colposcopy  Colposcopy is a procedure to examine your cervix and vagina, or the area around the outside of your vagina, for abnormalities or signs of disease. The procedure is done using a lighted microscope called a colposcope. Tissue samples may be collected during the colposcopy if your health care provider finds any unusual cells. A colposcopy may be done if a woman has:  · An abnormal Pap test. A Pap test is a medical test done to evaluate cells that are on the surface of the cervix.  · A Pap test result that is suggestive of human papillomavirus (HPV). This virus can cause genital warts and is linked to the development of cervical cancer.  · A sore on her cervix and the results of a Pap test were normal.  · Genital warts on the cervix or in or around the outside of the vagina.  · A mother who took the drug diethylstilbestrol (DES) while pregnant.  · Painful intercourse.  · Vaginal bleeding, especially after sexual intercourse.  LET YOUR HEALTH CARE PROVIDER KNOW ABOUT:  · Any allergies you have.  · All medicines you are taking, including vitamins, herbs, eye drops, creams, and over-the-counter medicines.  · Previous problems you or members of your family have had with the use of anesthetics.  · Any blood disorders you have.  · Previous surgeries you have had.  · Medical conditions you have.  RISKS AND COMPLICATIONS  Generally, a colposcopy is a safe procedure. However, as with any procedure, complications can occur. Possible complications include:  · Bleeding.  · Infection.  · Missed lesions.  BEFORE THE PROCEDURE   · Tell your health care provider if you have your menstrual period. A colposcopy typically is not done during menstruation.  · For 24 hours before the colposcopy, do not:    Douche.    Use tampons.    Use medicines, creams, or suppositories in the vagina.    Have sexual intercourse.  PROCEDURE   During the procedure, you will be lying on your back with your feet in foot rests (stirrups). A warm  metal or plastic instrument (speculum) will be placed in your vagina to keep it open and to allow the health care provider to see the cervix. The colposcope will be placed outside the vagina. It will be used to magnify and examine the cervix, vagina, and the area around the outside of the vagina. A small amount of liquid solution will be placed on the area that is to be viewed. This solution will make it easier to see the abnormal cells. Your health care provider will use tools to suck out mucus and cells from the canal of the cervix. Then he or she will record the location of the abnormal areas.  If a biopsy is done during the procedure, a medicine will usually be given to numb the area (local anesthetic). You may feel mild pain or cramping while the biopsy is done. After the procedure, tissue samples collected during the biopsy will be sent to a lab for analysis.  AFTER THE PROCEDURE   You will be given instructions on when to follow up with your health care provider for your test results. It is important to keep your appointment.     This information is not intended to replace advice given to you by your health care provider. Make sure you discuss any questions you have with your health care provider.     Document Released: 07/11/2002 Document Revised: 12/21/2012 Document Reviewed: 11/17/2012    Elsevier Interactive Patient Education ©2016 Elsevier Inc.

## 2015-10-14 NOTE — Addendum Note (Signed)
Addended by: Caryl Bis on: 10/14/2015 04:46 PM   Modules accepted: Orders

## 2015-10-15 ENCOUNTER — Encounter (HOSPITAL_COMMUNITY): Payer: Self-pay | Admitting: *Deleted

## 2015-10-16 ENCOUNTER — Other Ambulatory Visit: Payer: Self-pay | Admitting: Obstetrics and Gynecology

## 2015-10-16 DIAGNOSIS — R928 Other abnormal and inconclusive findings on diagnostic imaging of breast: Secondary | ICD-10-CM

## 2015-10-23 ENCOUNTER — Telehealth: Payer: Self-pay | Admitting: General Practice

## 2015-10-23 NOTE — Telephone Encounter (Signed)
Patient called and left message on nurse line stating she is returning our call. Called patient, no answer-left message stating we are trying to reach you, please call us back.

## 2015-10-23 NOTE — Telephone Encounter (Signed)
Pt left a message stating it is ok to leave a message on her cell phone

## 2015-10-23 NOTE — Telephone Encounter (Addendum)
Pt called @ 1107 stating that she is returning Carrie's call. Her phone does not ring and that is why she keeps missing the call. I called back and left another message stating that we are calling with important test results. She may give permission for [place] to leave a detailed message on her voice mail.  Per Carrie's note, pt needs to be advised of abnormal biopsy results from Colpo and need for LEEP. This has been scheduled for 6/18 @ 0845.

## 2015-10-23 NOTE — Telephone Encounter (Signed)
Per Dr Ernestina Patches, patient needs LEEP asap. Called patient, no answer- left message to call us back at the clinics for results.

## 2015-10-24 ENCOUNTER — Other Ambulatory Visit: Payer: Self-pay | Admitting: Obstetrics and Gynecology

## 2015-10-24 ENCOUNTER — Ambulatory Visit
Admission: RE | Admit: 2015-10-24 | Discharge: 2015-10-24 | Disposition: A | Discharge status: Home / Self Care | Payer: No Typology Code available for payment source | Source: Ambulatory Visit | Attending: Obstetrics and Gynecology | Admitting: Obstetrics and Gynecology

## 2015-10-24 DIAGNOSIS — N6310 Unspecified lump in the right breast, unspecified quadrant: Secondary | ICD-10-CM

## 2015-10-24 DIAGNOSIS — R928 Other abnormal and inconclusive findings on diagnostic imaging of breast: Secondary | ICD-10-CM

## 2015-10-25 ENCOUNTER — Ambulatory Visit (INDEPENDENT_AMBULATORY_CARE_PROVIDER_SITE_OTHER): Payer: Medicaid Other | Admitting: Obstetrics & Gynecology

## 2015-10-25 ENCOUNTER — Other Ambulatory Visit (HOSPITAL_COMMUNITY)
Admission: RE | Admit: 2015-10-25 | Discharge: 2015-10-25 | Disposition: A | Discharge status: Home / Self Care | Payer: Medicaid Other | Source: Ambulatory Visit | Attending: Obstetrics & Gynecology | Admitting: Obstetrics & Gynecology

## 2015-10-25 ENCOUNTER — Encounter: Payer: Self-pay | Admitting: Obstetrics & Gynecology

## 2015-10-25 VITALS — BP 132/89 | HR 71 | Temp 98.5°F | Ht 67.0 in | Wt 131.2 lb

## 2015-10-25 DIAGNOSIS — N871 Moderate cervical dysplasia: Secondary | ICD-10-CM | POA: Insufficient documentation

## 2015-10-25 LAB — POCT PREGNANCY, URINE: PREG TEST UR: NEGATIVE

## 2015-10-25 NOTE — Patient Instructions (Signed)

## 2015-10-25 NOTE — Progress Notes (Signed)
   GYNECOLOGY CLINIC PROCEDURE NOTE  Michelle Nielsen is a 55 y.o. [phone] here for LEEP. No GYN concerns. Pap smear and colposcopy reviewed.    Pap ASCUS, +HRHPV on 08/13/2015 Colpo Biopsy CIN II, ECC negative on 10/14/2015  Risks, benefits, alternatives, and limitations of procedure explained to patient, including pain, bleeding, infection, failure to remove abnormal tissue and failure to cure dysplasia, need for repeat procedures, damage to pelvic organs, cervical incompetence.  Role of HPV,cervical dysplasia and need for close followup was empasized. Informed written consent was obtained. All questions were answered. Time out performed. Urine pregnancy test was negative.  Procedure: The patient was placed in lithotomy position and the bivalved coated speculum was placed in the patient's vagina. A grounding pad placed on the patient. Lugol's solution was applied to the cervix and areas of decreased uptake were noted around the transformation zone.   Local anesthesia was administered via an intracervical block using 10cc of 2% Lidocaine with epinephrine. The suction was turned on and the Medium 1X Fisher Cone Biopsy Excisor on 83 Watts of cutting current was used to excise the area of decreased uptake and excise the entire transformation zone. Excellent hemostasis was achieved using roller ball coagulation set at 50 Watts coagulation current. Monsel's solution was then applied and the speculum was removed from the vagina. Specimens were sent to pathology.  The patient tolerated the procedure well. Post-operative instructions given to patient, including instruction to seek medical attention for persistent bright red bleeding, fever, abdominal/pelvic pain, dysuria, nausea or vomiting. She was also told about the possibility of having copious yellow to black tinged discharge for weeks. She was counseled to avoid anything in the vagina (sex/douching/tampons) for 3 weeks. She has a 4 week post-operative  check to assess wound healing, review results and discuss further management.   Verita Schneiders, MD, [place] Attending Obstetrician & Gynecologist, [place] [facility]

## 2015-10-28 ENCOUNTER — Encounter: Payer: Self-pay | Admitting: Obstetrics & Gynecology

## 2015-10-28 DIAGNOSIS — N871 Moderate cervical dysplasia: Secondary | ICD-10-CM | POA: Insufficient documentation

## 2015-10-29 ENCOUNTER — Ambulatory Visit
Admission: RE | Admit: 2015-10-29 | Discharge: 2015-10-29 | Disposition: A | Discharge status: Home / Self Care | Payer: No Typology Code available for payment source | Source: Ambulatory Visit | Attending: Obstetrics and Gynecology | Admitting: Obstetrics and Gynecology

## 2015-10-29 ENCOUNTER — Other Ambulatory Visit: Payer: Self-pay | Admitting: Obstetrics and Gynecology

## 2015-10-29 DIAGNOSIS — N6310 Unspecified lump in the right breast, unspecified quadrant: Secondary | ICD-10-CM

## 2015-10-29 NOTE — Telephone Encounter (Signed)
-----   Message from Osborne Oman, MD sent at 10/28/2015 12:05 PM EDT ----- Pathology showed CIN II (moderate dysplasia) with negative margins. Needs repeat pap and HPV testing in 12 months and 24 months.  Please call to inform patient of results and recommendations. Problem list updated.

## 2015-10-29 NOTE — Telephone Encounter (Signed)
Pt has been informed of results and will follow up on 11/21/2015.

## 2015-11-04 ENCOUNTER — Telehealth: Payer: Self-pay | Admitting: *Deleted

## 2015-11-04 NOTE — Telephone Encounter (Signed)
Returned patient's call. She stated she was achy below her belly button, was taking tylenol for pain but it doesn't really help. She denies fever or bleeding but said there is a greyish discharge since the LEEP. I reminded her that this is normal and can go on for a while as the cervix is healing. She also thought that the pain may be bladder related as she is prone to uti. I advised her that the clinic is closed until 7/5 and that it would be a good idea to go to an urgent care and have her urine checked for uti. If she continues to have the pain after clinic reopens then she should call for further advice. Patient voiced understanding.

## 2015-11-04 NOTE — Telephone Encounter (Signed)
Patient left message stating she had a LEEP procedure a week ago and feels "achy down there" wants to get antibiotics "just to be safe". Please return her call.

## 2015-11-21 ENCOUNTER — Encounter: Payer: Self-pay | Admitting: *Deleted

## 2015-11-21 ENCOUNTER — Ambulatory Visit: Payer: No Typology Code available for payment source | Admitting: Obstetrics & Gynecology

## 2015-11-21 ENCOUNTER — Telehealth: Payer: Self-pay | Admitting: *Deleted

## 2015-11-21 NOTE — Telephone Encounter (Signed)
Michelle Nielsen missed her appointment today for LEEP followup. Per discussion with Dr. Harolyn Rutherford will send letter with results and instructions for follow up and to call if problems. Letter sent.

## 2015-12-22 ENCOUNTER — Encounter: Payer: Self-pay | Admitting: Obstetrics & Gynecology

## 2016-01-09 ENCOUNTER — Ambulatory Visit (INDEPENDENT_AMBULATORY_CARE_PROVIDER_SITE_OTHER): Payer: Medicaid Other | Admitting: Obstetrics and Gynecology

## 2016-01-09 ENCOUNTER — Encounter: Payer: Self-pay | Admitting: Obstetrics and Gynecology

## 2016-01-09 VITALS — BP 120/90 | HR 87 | Ht 67.0 in | Wt 126.0 lb

## 2016-01-09 DIAGNOSIS — N871 Moderate cervical dysplasia: Secondary | ICD-10-CM | POA: Diagnosis not present

## 2016-01-09 NOTE — Progress Notes (Signed)
Pt seen today for follow up of LEEP procedure in June. She did not make it to her follow up visit and does not think she ever got her results. She does state that immediately following the procedure she had some signfcant discharge that she was concerned about but that has since resolved. She has no complaints oat this time and denies any vaginal pain or discharge. She was informed of her CIN II results and the need for co-testing at 12 months and 24 months. Pt voiced understanding.    Review of Systems  Constitutional: Negative for fever.  Gastrointestinal: Negative for abdominal pain, nausea and vomiting.  Genitourinary: Negative for dysuria, frequency and urgency.  Skin: Negative for itching and rash.   Physical Exam  Constitutional: She is oriented to person, place, and time and well-developed, well-nourished, and in no distress. No distress.  Cardiovascular: Normal rate, regular rhythm and normal heart sounds.   Pulmonary/Chest: Effort normal and breath sounds normal. No respiratory distress. She has no wheezes.  Genitourinary:  Genitourinary Comments: defered as pt has no complaints.   Neurological: She is alert and oriented to person, place, and time.  Skin: She is not diaphoretic.    A/P Moderate dysplasia of cervix (CIN II)  Pt with CINII. Informed of complete resection, but need for continued surveillance with 12 and 24 month paps. Offered vaginal exam today, but pt has no complaints and declined.

## 2016-01-09 NOTE — Patient Instructions (Addendum)
Your pathology showed CIN II or moderate dysplasia. You will need a repeat PAP test with HPV testing in June of 2018 and 2019. Please scheduled appointments.   Cervical Dysplasia Cervical dysplasia is a condition in which a woman has abnormal changes in the cells of her cervix. The cervix is the opening to the uterus (womb). It is located between the vagina and the uterus. Cervical dysplasia may be the first sign of cervical cancer.  With early detection, treatment, and close follow-up care, nearly all cases of cervical dysplasia can be cured. If left untreated, dysplasia may become more severe.  CAUSES  Cervical dysplasia can be caused by a human papillomavirus (HPV) infection. RISK FACTORS   Having had a sexually transmitted disease, such as chlamydia or a human papillomavirus (HPV) infection.   Becoming sexually active before age 57.   Having had more than one sexual partner.   Not using protection during sexual intercourse, especially with new sexual partners.   Having had cancer of the vagina or vulva.   Having a sexual partner whose previous partner had cancer of the cervix or cervical dysplasia.   Having a sexual partner who has or has had cancer of the penis.   Having a weakened immune system (such as from having HIV or an organ transplant).   Being the daughter of a woman who took diethylstilbestrol(DES) during pregnancy.   Having a family history of cervical cancer.   Smoking. SIGNS AND SYMPTOMS  There are usually no symptoms. If there are symptoms, they may include:   Abnormal vaginal discharge.   Bleeding between periods or after intercourse.   Bleeding during menopause.   Pain during sexual intercourse (dyspareunia). DIAGNOSIS  A test called a Pap test may be done.During this test, cells are taken from the cervix and then looked at under a microscope. A test in which tissue is removed from the cervix (biopsy) may also be done if the Pap test is  abnormal or if the cervix looks abnormal.  TREATMENT  Treatment varies based on the severity of the cervical dysplasia. Treatment may include:  Cryotherapy. During cryotherapy, the abnormal cells are frozen with a steel-tip instrument.   A procedure to remove abnormal tissue from the cervix.  Surgery to remove abnormal tissue. This is usually done in serious cases of cervical dysplasia. Surgical options include:  A cone biopsy. This is a procedure in which the cervical canal and a portion of the center of the cervix are removed.   Hysterectomy. This is a surgery in which the uterus and cervix are removed. HOME CARE INSTRUCTIONS   Only take over-the-counter or prescription medicines for pain or discomfort as directed by your health care provider.   Do not use tampons, have sexual intercourse, or douche until your health care provider says it is okay.  Keep follow-up appointments as directed by your health care provider. Women who have been treated for cervical dysplasia should have regular pelvic exams and Pap tests. During the first year following treatment of cervical dysplasia, Pap tests should be done every 3-4 months. In the second year, they should be done every 6 months or as recommended by your health care provider.  To prevent the condition from developing again, practice safe sex. SEEK MEDICAL CARE IF:  You develop genital warts.  SEEK IMMEDIATE MEDICAL CARE IF:   Your menstrual period is heavier than normal.   You develop bright red bleeding, especially if you have blood clots.   You have a  fever.   You have increasing cramps or pain not relieved with medicine.   You are light-headed, unusually weak, or have fainting spells.   You have abnormal vaginal discharge.   You have abdominal pain.   This information is not intended to replace advice given to you by your health care provider. Make sure you discuss any questions you have with your health care  provider.   Document Released: 04/20/2005 Document Revised: 04/25/2013 Document Reviewed: 12/14/2012 Elsevier Interactive Patient Education [employer].

## 2016-05-19 ENCOUNTER — Other Ambulatory Visit: Payer: Self-pay | Admitting: Obstetrics and Gynecology

## 2016-05-19 DIAGNOSIS — N6011 Diffuse cystic mastopathy of right breast: Secondary | ICD-10-CM

## 2016-05-25 ENCOUNTER — Ambulatory Visit
Admission: RE | Admit: 2016-05-25 | Discharge: 2016-05-25 | Disposition: A | Discharge status: Home / Self Care | Payer: Medicaid Other | Source: Ambulatory Visit | Attending: Obstetrics and Gynecology | Admitting: Obstetrics and Gynecology

## 2016-05-25 ENCOUNTER — Other Ambulatory Visit: Payer: Self-pay | Admitting: Obstetrics and Gynecology

## 2016-05-25 DIAGNOSIS — N6011 Diffuse cystic mastopathy of right breast: Secondary | ICD-10-CM

## 2016-11-09 ENCOUNTER — Other Ambulatory Visit: Payer: Self-pay | Admitting: Obstetrics and Gynecology

## 2016-11-09 DIAGNOSIS — Z1231 Encounter for screening mammogram for malignant neoplasm of breast: Secondary | ICD-10-CM

## 2016-11-24 ENCOUNTER — Ambulatory Visit (HOSPITAL_COMMUNITY): Payer: Medicaid Other

## 2016-12-31 ENCOUNTER — Encounter (HOSPITAL_COMMUNITY): Payer: Self-pay

## 2016-12-31 ENCOUNTER — Ambulatory Visit
Admission: RE | Admit: 2016-12-31 | Discharge: 2016-12-31 | Disposition: A | Discharge status: Home / Self Care | Payer: Medicaid Other | Source: Ambulatory Visit | Attending: Obstetrics and Gynecology | Admitting: Obstetrics and Gynecology

## 2016-12-31 ENCOUNTER — Ambulatory Visit (HOSPITAL_COMMUNITY)
Admission: RE | Admit: 2016-12-31 | Discharge: 2016-12-31 | Disposition: A | Discharge status: Home / Self Care | Payer: Self-pay | Source: Ambulatory Visit | Attending: Obstetrics and Gynecology | Admitting: Obstetrics and Gynecology

## 2016-12-31 VITALS — BP 144/104 | HR 80 | Temp 98.5°F | Ht 67.0 in | Wt 122.0 lb

## 2016-12-31 DIAGNOSIS — Z1231 Encounter for screening mammogram for malignant neoplasm of breast: Secondary | ICD-10-CM

## 2016-12-31 DIAGNOSIS — N898 Other specified noninflammatory disorders of vagina: Secondary | ICD-10-CM

## 2016-12-31 DIAGNOSIS — Z01419 Encounter for gynecological examination (general) (routine) without abnormal findings: Secondary | ICD-10-CM

## 2016-12-31 NOTE — Patient Instructions (Addendum)
Explained breast self awareness with [place] Ponder. Let patient know that if today's Pap smear is normal that her next Pap smear is due in one year due to her recent history of an abnormal Pap smear. Referred patient to the [facility] for a screening mammogram. Appointment scheduled for Thursday, December 31, 2016 at 1240. Let patient know will follow up with her within the next couple weeks with results of Pap smear and wet prep by phone. Informed patient that the [facility] will follow up with her within the next couple of weeks with results of mammogram by letter or phone. Discussed smoking cessation with patient. Referred to the [facility] Quitline and gave resources to free smoking cessation classes at [employer] Renda Rolls Faller verbalized understanding.  Danyia Borunda, Arvil Chaco, RN 11:32 AM

## 2016-12-31 NOTE — Progress Notes (Addendum)
No complaints today.   Pap Smear: Pap smear completed today. Last Pap smear was 08/13/2015 at [facility] and ASCUS with positive HPV. Patient had a colposcopy to follow up for abnormal Pap smear on 10/13/2016 that showed squamous dysplasia and a LEEP 10/25/2015 that showed CIN II. Per patient her last Pap smear is the only abnormal Pap smear she has had. Last Pap smear, colposcopy, and LEEP results are in EPIC.  Physical exam: Breasts Breasts symmetrical. No skin abnormalities bilateral breasts. No nipple retraction bilateral breasts. No nipple discharge bilateral breasts. No lymphadenopathy. No lumps palpated bilateral breasts. No complaints of pain or tenderness on exam. Referred patient to the [facility] for a screening mammogram. Appointment scheduled for Thursday, December 31, 2016 at 1240.  Pelvic/Bimanual   Ext Genitalia No lesions, no swelling and no discharge observed on external genitalia.         Vagina Vagina pink and normal texture. No lesions and thick white discharge observed in vagina. Wet prep completed.          Cervix Cervix is present. Cervix pink and of normal texture. No discharge observed.     Uterus Uterus is present and palpable. Uterus in normal position and normal size.        Adnexae Bilateral ovaries present and palpable. No tenderness on palpation.          Rectovaginal No rectal exam completed today since patient had no rectal complaints. No skin abnormalities observed on exam.    Smoking History: Patient is a current smoker. Discussed smoking cessation with patient. Referred to the [facility] Quitline and gave resources to free smoking cessation classes at [facility].  Patient Navigation: Patient education provided. Access to services provided for patient through [place] program.   Colorectal Cancer Screening: Per patient has never had a colonoscopy completed. No complaints today. FIT Test given to patient to complete and  return to [facility].

## 2016-12-31 NOTE — Addendum Note (Signed)
Encounter addended by: Loletta Parish, RN on: 12/31/2016  4:32 PM<BR>    Actions taken: Visit diagnoses modified

## 2017-01-05 LAB — CYTOLOGY - PAP
BACTERIAL VAGINITIS: POSITIVE — AB
CANDIDA VAGINITIS: NEGATIVE
HPV: DETECTED — AB
TRICH (WINDOWPATH): NEGATIVE

## 2017-01-06 ENCOUNTER — Other Ambulatory Visit: Payer: Self-pay | Admitting: Obstetrics and Gynecology

## 2017-01-06 ENCOUNTER — Telehealth (HOSPITAL_COMMUNITY): Payer: Self-pay | Admitting: *Deleted

## 2017-01-06 MED ORDER — METRONIDAZOLE 500 MG PO TABS: 500.0000 mg | ORAL_TABLET | Freq: Two times a day (BID) | ORAL | 0 refills | Status: DC

## 2017-01-06 NOTE — Telephone Encounter (Signed)
Called patient to discuss Pap smear and wet prep result. Explained to patient that her Pap smear was abnormal and positive for HPV. Let her know a colposcopy is recommended for follow up and that [facility] will cover. Told patient have sent referral to the [facility] [place] at [facility] for appointment. Let patient know will call her back with appointment. Explained to patient that her wet prep showed BV and that a prescription for Flagyl was sent to [facility]. Advised patient to avoid alcohol while taking antibiotic. Patient verbalized understanding.

## 2017-01-08 ENCOUNTER — Telehealth (HOSPITAL_COMMUNITY): Payer: Self-pay | Admitting: *Deleted

## 2017-01-08 NOTE — Telephone Encounter (Signed)
Called patient and gave her appointment at the [facility] [place] at [facility] for her colposcopy to follow-up for her abnormal Pap smear. Appointment is scheduled for Wednesday, January 27, 2017 at 1440. Gave patient directions on appointment location and answered questions. Patient verbalized understanding.

## 2017-01-27 ENCOUNTER — Other Ambulatory Visit (HOSPITAL_COMMUNITY)
Admission: RE | Admit: 2017-01-27 | Discharge: 2017-01-27 | Disposition: A | Discharge status: Home / Self Care | Payer: No Typology Code available for payment source | Source: Ambulatory Visit | Attending: Medical | Admitting: Medical

## 2017-01-27 ENCOUNTER — Ambulatory Visit (INDEPENDENT_AMBULATORY_CARE_PROVIDER_SITE_OTHER): Payer: Self-pay | Admitting: Medical

## 2017-01-27 VITALS — BP 132/83 | HR 83 | Ht 67.0 in | Wt 120.0 lb

## 2017-01-27 DIAGNOSIS — A630 Anogenital (venereal) warts: Secondary | ICD-10-CM

## 2017-01-27 DIAGNOSIS — R87612 Low grade squamous intraepithelial lesion on cytologic smear of cervix (LGSIL): Secondary | ICD-10-CM

## 2017-01-27 MED ORDER — IMIQUIMOD 5 % EX CREA: TOPICAL_CREAM | Freq: Every day | CUTANEOUS | 0 refills | Status: DC

## 2017-01-27 NOTE — Patient Instructions (Signed)
Colposcopy, Care After  This sheet gives you information about how to care for yourself after your procedure. Your doctor may also give you more specific instructions. If you have problems or questions, contact your doctor.  What can I expect after the procedure?  If you did not have a tissue sample removed (did not have a biopsy), you may only have some spotting for a few days. You can go back to your normal activities.  If you had a tissue sample removed, it is common to have:  · Soreness and pain. This may last for a few days.  · Light-headedness.  · Mild bleeding from your vagina or dark-colored, grainy discharge from your vagina. This may last for a few days. You may need to wear a sanitary pad.  · Spotting for at least 48 hours after the procedure.    Follow these instructions at home:  · Take over-the-counter and prescription medicines only as told by your doctor. Ask your doctor what medicines you can start taking again. This is very important if you take blood-thinning medicine.  · Do not drive or use heavy machinery while taking prescription pain medicine.  · For 3 days, or as long as your doctor tells you, avoid:  ? Douching.  ? Using tampons.  ? Having sex.  · If you use birth control (contraception), keep using it.  · Limit activity for the first day after the procedure. Ask your doctor what activities are safe for you.  · It is up to you to get the results of your procedure. Ask your doctor when your results will be ready.  · Keep all follow-up visits as told by your doctor. This is important.  Contact a doctor if:  · You get a skin rash.  Get help right away if:  · You are bleeding a lot from your vagina. It is a lot of bleeding if you are using more than one pad an hour for 2 hours in a row.  · You have clumps of blood (blood clots) coming from your vagina.  · You have a fever.  · You have chills  · You have pain in your lower belly (pelvic area).  · You have signs of infection, such as vaginal  discharge that is:  ? Different than usual.  ? Yellow.  ? Bad-smelling.  · You have very pain or cramps in your lower belly that do not get better with medicine.  · You feel light-headed.  · You feel dizzy.  · You pass out (faint).  Summary  · If you did not have a tissue sample removed (did not have a biopsy), you may only have some spotting for a few days. You can go back to your normal activities.  · If you had a tissue sample removed, it is common to have mild pain and spotting for 48 hours.  · For 3 days, or as long as your doctor tells you, avoid douching, using tampons and having sex.  · Get help right away if you have bleeding, very bad pain, or signs of infection.  This information is not intended to replace advice given to you by your health care provider. Make sure you discuss any questions you have with your health care provider.  Document Released: 10/07/2007 Document Revised: 01/08/2016 Document Reviewed: 01/08/2016  Elsevier Interactive Patient Education © 2018 Elsevier Inc.

## 2017-01-27 NOTE — Progress Notes (Signed)
    GYNECOLOGY CLINIC COLPOSCOPY PROCEDURE NOTE  [name] is a 56 y.o. [phone] here for colposcopy for low-grade squamous intraepithelial neoplasia (LGSIL - encompassing HPV,mild dysplasia,CIN I) pap smear on 12/31/16. The patient also had a LEEP 10/2015.  Discussed role for HPV in cervical dysplasia, need for surveillance.  Patient given informed consent, signed copy in the chart, time out was performed.  Placed in lithotomy position. Cervix viewed with speculum and colposcope after application of acetic acid.   Colposcopy adequate? No  no visible lesions, no mosaicism, no punctation and no abnormal vasculature; biopsies obtained.  ECC specimen obtained. Patient had total cervical stenosis, likely due to postmenopausal status and previous LEEP. Dr. Harolyn Rutherford was able to use cervical dilators to obtain ECC. Patient tolerated well.  All specimens were labelled and sent to pathology.   Patient was given post procedure instructions.  Will follow up pathology and manage accordingly.  Routine preventative health maintenance measures emphasized.  It was also noted on exam that the patient has condylomatous tissue on the perineum. Rx for Aldara given to patient.   Luvenia Redden, PA-C 01/27/2017 3:12 PM

## 2017-02-04 ENCOUNTER — Telehealth: Payer: Self-pay | Admitting: *Deleted

## 2017-02-04 NOTE — Telephone Encounter (Signed)
Patient left message on nurse voicemail today at 1032 am.  States she would like to get test results from last week.   Return call to patient.  Results given.  Explained she would need repeat pap in 6 months with HPV testing.  Encouraged patient to call office in January to schedule appointment in March.  Patient states understanding.    States she picked Aldara cream on Monday.  States she didn't have the money before then.  I told her if the cream doesn't help to let us know.  Patient states understanding.

## 2017-03-03 NOTE — Addendum Note (Signed)
Encounter addended by: Loletta Parish, RN on: 03/03/2017 12:59 PM<BR>    Actions taken: Sign clinical note

## 2017-08-10 ENCOUNTER — Other Ambulatory Visit: Payer: Self-pay

## 2017-08-12 LAB — FECAL OCCULT BLOOD, IMMUNOCHEMICAL: FECAL OCCULT BLD: NEGATIVE

## 2017-08-23 ENCOUNTER — Encounter (HOSPITAL_COMMUNITY): Payer: Self-pay

## 2017-08-23 ENCOUNTER — Other Ambulatory Visit: Payer: Self-pay

## 2017-08-25 LAB — FECAL OCCULT BLOOD, IMMUNOCHEMICAL: Fecal Occult Bld: POSITIVE — AB

## 2017-08-25 LAB — SPECIMEN STATUS REPORT

## 2017-08-31 ENCOUNTER — Ambulatory Visit (HOSPITAL_COMMUNITY): Payer: No Typology Code available for payment source

## 2017-09-01 ENCOUNTER — Telehealth (HOSPITAL_COMMUNITY): Payer: Self-pay | Admitting: *Deleted

## 2017-09-01 NOTE — Telephone Encounter (Signed)
Attempted to call patient to discuss FIT Test result. No one answered phone and patients voicemail is not set up.

## 2017-09-10 ENCOUNTER — Telehealth (HOSPITAL_COMMUNITY): Payer: Self-pay | Admitting: *Deleted

## 2017-09-10 NOTE — Telephone Encounter (Signed)
Attempted to call patient to discuss FIT Test result. No one answered phone and patients voicemail box is not set up. Will send patient letter to call me.

## 2017-09-22 ENCOUNTER — Encounter (HOSPITAL_COMMUNITY): Payer: Self-pay | Admitting: *Deleted

## 2017-09-22 NOTE — Progress Notes (Signed)
Letter mailed to patient to contact office concerning Fit Test results.

## 2017-10-26 ENCOUNTER — Ambulatory Visit (HOSPITAL_COMMUNITY)
Admission: RE | Admit: 2017-10-26 | Discharge: 2017-10-26 | Disposition: A | Discharge status: Home / Self Care | Payer: Self-pay | Source: Ambulatory Visit | Attending: Obstetrics and Gynecology | Admitting: Obstetrics and Gynecology

## 2017-10-26 ENCOUNTER — Encounter (HOSPITAL_COMMUNITY): Payer: Self-pay

## 2017-10-26 VITALS — BP 112/80 | Ht 67.0 in

## 2017-10-26 DIAGNOSIS — Z01419 Encounter for gynecological examination (general) (routine) without abnormal findings: Secondary | ICD-10-CM

## 2017-10-26 NOTE — Addendum Note (Signed)
Encounter addended by: [name], LPN on: 05/05/5484 28:24 PM  Actions taken: Order list changed

## 2017-10-26 NOTE — Progress Notes (Signed)
No complaints today.   Pap Smear: Pap smear completed today. Last Pap smear was 12/31/2016 at [facility] and [facility] with positive HPV.  Patient had a colposcopy completed 01/27/2017 that was inadequate and a 67-month follow-up Pap smear recommended. Patient has a history of an abnormal Pap smear 08/13/2015 at [facility] and ASCUS with positive HPV. Patient had a colposcopy to follow up for the abnormal Pap smear on 10/13/2016 that showed squamous dysplasia and a LEEP 10/25/2015 that showed CIN II. Per patient her last two Pap smears are the only abnormal Pap smears she has had. Last two Pap smears, two colposcopies, and LEEP results are in EPIC.    Pelvic/Bimanual   Ext Genitalia No lesions, no swelling and no discharge observed on external genitalia.         Vagina Vagina pink and normal texture. No lesions or discharge observed in vagina.          Cervix Cervix is present. Cervix pink and of normal texture. No discharge observed.     Uterus Uterus is present and palpable. Uterus in normal position and normal size.        Adnexae Bilateral ovaries present and palpable. No tenderness on palpation.         Rectovaginal No rectal exam completed today since patient had no rectal complaints. No skin abnormalities observed on exam.    Smoking History: Patient is a current smoker. Discussed smoking cessation with patient. Referred to the [facility] Quitline and gave resources to free smoking cessation classes at [facility].  Patient Navigation: Patient education provided. Access to services provided for patient through Center program.   Colorectal Cancer Screening: Per patient has never had a colonoscopy completed. No complaints today. Patient completed two FIT Tests 08/10/2017 that was negative and 08/23/2017 that was positive. Referred patient to the [facility] program to be referred to a PCP to establish care and follow-up for positive FIT Test result. Appointment with [facility] is  Wednesday, October 27, 2017 at 0900.   Breast and Cervical Cancer Risk Assessment: Patient has no family history of breast cancer, known genetic mutations, or radiation treatment to the chest before age 70. Patient has a history of cervical dysplasia. Patient has no history of being immunocompromised or DES exposure in-utero. Patient has a 5-year risk for breast cancer at 1.4% and a lifetime risk at 7.6%.

## 2017-10-26 NOTE — Patient Instructions (Addendum)
Explained breast self awareness with Michelle Nielsen. Let patient know that follow-up for today's Pap smear will be based on the result. Let patient know will follow up with her within the next couple weeks with results of Pap smear by letter or phone. Discussed smoking cessation with patient. Referred to the [facility] Quitline and gave resources to free smoking cessation classes at [facility]. Referred patient to the [facility] program to be referred to a PCP to establish care and follow-up for positive FIT Test result. Appointment with [facility] is Wednesday, October 27, 2017 at 0900. Renda Rolls Kinney verbalized understanding.  Radonna Bracher, Arvil Chaco, RN 11:43 AM

## 2017-10-27 ENCOUNTER — Other Ambulatory Visit (HOSPITAL_COMMUNITY): Payer: Self-pay | Admitting: *Deleted

## 2017-10-27 ENCOUNTER — Inpatient Hospital Stay: Payer: No Typology Code available for payment source | Attending: Obstetrics and Gynecology | Admitting: *Deleted

## 2017-10-27 ENCOUNTER — Inpatient Hospital Stay: Payer: No Typology Code available for payment source

## 2017-10-27 VITALS — BP 118/68 | Ht 65.0 in | Wt 119.4 lb

## 2017-10-27 DIAGNOSIS — Z0000 Encounter for general adult medical examination without abnormal findings: Secondary | ICD-10-CM

## 2017-10-27 LAB — LIPID PANEL
Cholesterol: 253 mg/dL — ABNORMAL HIGH (ref 0–200)
HDL: 73 mg/dL (ref >40)
LDL CALC: 124 mg/dL — AB (ref 0–99)
TRIGLYCERIDES: 282 mg/dL — AB (ref <150)
Total CHOL/HDL Ratio: 3.5 RATIO
VLDL: 56 mg/dL — ABNORMAL HIGH (ref 0–40)

## 2017-10-27 LAB — HEMOGLOBIN A1C
HEMOGLOBIN A1C: 5.8 % — AB (ref 4.8–5.6)
Mean Plasma Glucose: 119.76 mg/dL

## 2017-10-27 NOTE — Progress Notes (Signed)
[facility] initial screening  Clinical Measurement:  Height: 65in.   Weight: 119.4lb Blood Pressure: 120/74  Blood Pressure #2:  118/68  Fasting Labs Drawn Today, will review with patient when they result.  Medical History:  Patient states that she has not been diagnosed with high cholesterol, high blood pressure, diabetes or heart disease.  Medications:  Patients states she is not taking any medications for high cholesterol, high blood pressure or diabetes.  She is not taking aspirin daily to prevent heart attack or stroke.    Blood pressure, self measurement:  Patients states she does not measure blood pressure at home.    Nutrition:  Patient states she eats 1 cup of fruit and 1 cup of vegetables in an average day.  Patient states she does  eat fish regularly, she eats more than half a serving of whole grains daily. She drinks less than 36 ounces of beverages with added sugar weekly.  She is currently not watching her sodium intake.  She has not had any drinks containing alcohol in the last seven days.    Physical activity:  Patient states that she gets 600 minutes of moderate exercise in a week.  She gets 0 minutes of vigorous exercise per week.    Smoking status:  Patient states she  smokes a pack of cigarettes per day but  is not around any other  smokers.    Quality of life:  Patient states that she has had 0 bad physical days out of the last 30 days. In the last 2 weeks, she has had 4 days that she has felt down or depressed. She has had 1 day in the last 2 weeks that she has had little interest or pleasure in doing things.  Risk reduction and counseling:  Patient states she wants to  increase fruit and vegetable intake. Patient states she wants to quit smoking and was given literature at her [place] appointment on June 25 th.  I encouraged her to continue with current exercise regimen and increase vegetable and fruit intake.  Navigation:  I will notify patient of lab results.  Patient is  aware of 2 more health coaching sessions and a follow up.

## 2017-10-28 LAB — CYTOLOGY - PAP
ADEQUACY: ABSENT
Diagnosis: NEGATIVE
HPV: NOT DETECTED

## 2017-11-01 ENCOUNTER — Encounter: Payer: Self-pay | Admitting: Internal Medicine

## 2017-11-01 ENCOUNTER — Other Ambulatory Visit: Payer: Self-pay

## 2017-11-01 ENCOUNTER — Ambulatory Visit (INDEPENDENT_AMBULATORY_CARE_PROVIDER_SITE_OTHER): Payer: Self-pay | Admitting: Internal Medicine

## 2017-11-01 VITALS — BP 125/88 | HR 91 | Temp 98.4°F | Ht 67.0 in | Wt 124.1 lb

## 2017-11-01 DIAGNOSIS — R7303 Prediabetes: Secondary | ICD-10-CM | POA: Insufficient documentation

## 2017-11-01 DIAGNOSIS — R012 Other cardiac sounds: Secondary | ICD-10-CM

## 2017-11-01 DIAGNOSIS — F17200 Nicotine dependence, unspecified, uncomplicated: Secondary | ICD-10-CM

## 2017-11-01 DIAGNOSIS — E782 Mixed hyperlipidemia: Secondary | ICD-10-CM

## 2017-11-01 DIAGNOSIS — R195 Other fecal abnormalities: Secondary | ICD-10-CM

## 2017-11-01 DIAGNOSIS — F17210 Nicotine dependence, cigarettes, uncomplicated: Secondary | ICD-10-CM

## 2017-11-01 NOTE — Patient Instructions (Addendum)
[name], nice to meet you.  Please be sure to see [place] to get established and get an orange card.  Please schedule a follow up visit after you get your orange card so we can scheduled you a colonoscopy.  For now follow the dietary changes below for your high cholesterol and prediabetes.     Cholesterol Cholesterol is a fat. Your body needs a small amount of cholesterol. Cholesterol (plaque) may build up in your blood vessels (arteries). That makes you more likely to have a heart attack or stroke. You cannot feel your cholesterol level. Having a blood test is the only way to find out if your level is high. Keep your test results. Work with your doctor to keep your cholesterol at a good level. What do the results mean?  Total cholesterol is how much cholesterol is in your blood.  LDL is bad cholesterol. This is the type that can build up. Try to have low LDL.  HDL is good cholesterol. It cleans your blood vessels and carries LDL away. Try to have high HDL.  Triglycerides are fat that the body can store or burn for energy. What are good levels of cholesterol?  Total cholesterol below 200.  LDL below 100 is good for people who have health risks. LDL below 70 is good for people who have very high risks.  HDL above 40 is good. It is best to have HDL of 60 or higher.  Triglycerides below 150. How can I lower my cholesterol? Diet Follow your diet program as told by your doctor.  Choose fish, white meat chicken, or [place] that is roasted or baked. Try not to eat red meat, fried foods, sausage, or lunch meats.  Eat lots of fresh fruits and vegetables.  Choose whole grains, beans, pasta, potatoes, and cereals.  Choose olive oil, corn oil, or canola oil. Only use small amounts.  Try not to eat butter, mayonnaise, shortening, or palm kernel oils.  Try not to eat foods with trans fats.  Choose low-fat or nonfat dairy foods. ? Drink skim or nonfat milk. ? Eat low-fat or nonfat  yogurt and cheeses. ? Try not to drink whole milk or cream. ? Try not to eat ice cream, egg yolks, or full-fat cheeses.  Healthy desserts include angel food cake, ginger snaps, animal crackers, hard candy, popsicles, and low-fat or nonfat frozen yogurt. Try not to eat pastries, cakes, pies, and cookies.  Exercise Follow your exercise program as told by your doctor.  Be more active. Try gardening, walking, and taking the stairs.  Ask your doctor about ways that you can be more active.  Medicine  Take over-the-counter and prescription medicines only as told by your doctor. This information is not intended to replace advice given to you by your health care provider. Make sure you discuss any questions you have with your health care provider. Document Released: 07/17/2008 Document Revised: 11/20/2015 Document Reviewed: 10/31/2015 Elsevier Interactive Patient Education  2018 [employer].  Prediabetes Prediabetes is the condition of having a blood sugar (blood glucose) level that is higher than it should be, but not high enough for you to be diagnosed with type 2 diabetes. Having prediabetes puts you at risk for developing type 2 diabetes (type 2 diabetes mellitus). Prediabetes may be called impaired glucose tolerance or impaired fasting glucose. Prediabetes usually does not cause symptoms. Your health care provider can diagnose this condition with blood tests. You may be tested for prediabetes if you are overweight and  if you have at least one other risk factor for prediabetes. Risk factors for prediabetes include:  Having a family member with type 2 diabetes.  Being overweight or obese.  Being older than age 9.  Being of American-Indian, African-American, Hispanic/Latino, or Asian/Pacific Islander descent.  Having an inactive (sedentary) lifestyle.  Having a history of gestational diabetes or polycystic ovarian syndrome (PCOS).  Having low levels of good cholesterol (HDL-C) or  high levels of blood fats (triglycerides).  Having high blood pressure.  What is blood glucose and how is blood glucose measured?  Blood glucose refers to the amount of glucose in your bloodstream. Glucose comes from eating foods that contain sugars and starches (carbohydrates) that the body breaks down into glucose. Your blood glucose level may be measured in mg/dL (milligrams per deciliter) or mmol/L (millimoles per liter).Your blood glucose may be checked with one or more of the following blood tests:  A fasting blood glucose (FBG) test. You will not be allowed to eat (you will fast) for at least 8 hours before a blood sample is taken. ? A normal range for FBG is 70-100 mg/dl (3.9-5.6 mmol/L).  An A1c (hemoglobin A1c) blood test. This test provides information about blood glucose control over the previous 2?30months.  An oral glucose tolerance test (OGTT). This test measures your blood glucose twice: ? After fasting. This is your baseline level. ? Two hours after you drink a beverage that contains glucose.  You may be diagnosed with prediabetes:  If your FBG is 100?125 mg/dL (5.6-6.9 mmol/L).  If your A1c level is 5.7?6.4%.  If your OGGT result is 140?199 mg/dL (7.8-11 mmol/L).  These blood tests may be repeated to confirm your diagnosis. What happens if blood glucose is too high? The pancreas produces a hormone (insulin) that helps move glucose from the bloodstream into cells. When cells in the body do not respond properly to insulin that the body makes (insulin resistance), excess glucose builds up in the blood instead of going into cells. As a result, high blood glucose (hyperglycemia) can develop, which can cause many complications. This is a symptom of prediabetes. What can happen if blood glucose stays higher than normal for a long time? Having high blood glucose for a long time is dangerous. Too much glucose in your blood can damage your nerves and blood vessels. Long-term  damage can lead to complications from diabetes, which may include:  Heart disease.  Stroke.  Blindness.  Kidney disease.  Depression.  Poor circulation in the feet and legs, which could lead to surgical removal (amputation) in severe cases.  How can prediabetes be prevented from turning into type 2 diabetes?  To help prevent type 2 diabetes, take the following actions:  Be physically active. ? Do moderate-intensity physical activity for at least 30 minutes on at least 5 days of the week, or as much as told by your health care provider. This could be brisk walking, biking, or water aerobics. ? Ask your health care provider what activities are safe for you. A mix of physical activities may be best, such as walking, swimming, cycling, and strength training.  Lose weight as told by your health care provider. ? Losing 5-7% of your body weight can reverse insulin resistance. ? Your health care provider can determine how much weight loss is best for you and can help you lose weight safely.  Follow a healthy meal plan. This includes eating lean proteins, complex carbohydrates, fresh fruits and vegetables, low-fat dairy products, and healthy  fats. ? Follow instructions from your health care provider about eating or drinking restrictions. ? Make an appointment to see a diet and nutrition specialist (registered dietitian) to help you create a healthy eating plan that is right for you.  Do not smoke or use any tobacco products, such as cigarettes, chewing tobacco, and e-cigarettes. If you need help quitting, ask your health care provider.  Take over-the-counter and prescription medicines as told by your health care provider. You may be prescribed medicines that help lower the risk of type 2 diabetes.  This information is not intended to replace advice given to you by your health care provider. Make sure you discuss any questions you have with your health care provider. Document Released:  08/12/2015 Document Revised: 09/26/2015 Document Reviewed: 06/11/2015 Elsevier Interactive Patient Education  [employer].

## 2017-11-01 NOTE — Progress Notes (Signed)
Internal Medicine Clinic Attending  Case discussed with Dr. Winfrey  at the time of the visit.  We reviewed the resident's history and exam and pertinent patient test results.  I agree with the assessment, diagnosis, and plan of care documented in the resident's note.  

## 2017-11-01 NOTE — Assessment & Plan Note (Signed)
Pt is still smoking 1ppd currently trying to transition to e cigarettes. I discussed this is still not ideal as more evidence points towards their deleterious effects.  She is willing to try nicotene patches and will purchase some today.  We will follow up at next visit.

## 2017-11-01 NOTE — Assessment & Plan Note (Signed)
Lab Results  Component Value Date   HGBA1C 5.8 (H) 10/27/2017   Patient is not overweight and does not have a family history of diabetes.  Likely diet related.    -We discussed dietary changes and she was given a diet plan to help guide her.  We will recheck A1C in about 6 months

## 2017-11-01 NOTE — Progress Notes (Signed)
CC: abnormal FIT test result  HPI:  [name] is a 57 y.o. female with PMH below.  She is here to establish care after a [facility] screening FIT test was found to be positive.  We also discussed her screening lipid panel which showed hyperlipidemia and prediabetes seen on A1C. Her ASCVD risk 6.1% is borderline. We also discussed strategies to quit smoking.  Please see A&P for status of the patient's chronic medical conditions  Past Medical History:  Diagnosis Date  . Abnormal mammogram of right breast    having biopsy 10/29/15  . Vaginal Pap smear, abnormal   Hearing loss from chronic ear infections post surgical intervention   Review of Systems:    Review of Systems  Constitutional: Negative for chills, fever and weight loss.  HENT: Negative for ear discharge and nosebleeds.   Eyes: Negative for double vision and photophobia.  Respiratory: Negative for shortness of breath and wheezing.   Cardiovascular: Negative for chest pain and palpitations.  Gastrointestinal: Negative for diarrhea, nausea and vomiting.  Genitourinary: Negative for dysuria and flank pain.  Musculoskeletal: Negative for falls and myalgias.  Skin: Negative for rash.  Neurological: Negative for dizziness and speech change.  Psychiatric/Behavioral: Negative for depression and suicidal ideas.    Physical Exam:  Vitals:   11/01/17 0917  BP: 125/88  Pulse: 91  Temp: 98.4 F (36.9 C)  TempSrc: Oral  SpO2: 99%  Weight: 124 lb 1.6 oz (56.3 kg)  Height: 5\' 7"  (1.702 m)   Physical Exam  Constitutional: No distress.  HENT:  Head: Normocephalic and atraumatic.  Eyes: Right eye exhibits no discharge. Left eye exhibits no discharge.  Cardiovascular: Normal rate and regular rhythm. Exam reveals no gallop and no friction rub.  No murmur heard. Loud P2  Pulmonary/Chest: Effort normal and breath sounds normal. No respiratory distress. She has no wheezes. She has no rales. She exhibits no tenderness.    Abdominal: Soft. Bowel sounds are normal. She exhibits no distension and no mass. There is no tenderness. There is no rebound and no guarding.  Musculoskeletal: She exhibits no edema or deformity.  Neurological: She is alert.  Skin: She is not diaphoretic.  Psychiatric: She has a normal mood and affect.    Social History   Socioeconomic History  . Marital status: Single    Spouse name: Not on file  . Number of children: Not on file  . Years of education: Not on file  . Highest education level: Not on file  Occupational History  . Not on file  Social Needs  . Financial resource strain: Not on file  . Food insecurity:    Worry: Not on file    Inability: Not on file  . Transportation needs:    Medical: Not on file    Non-medical: Not on file  Tobacco Use  . Smoking status: Current Every Day Smoker    Packs/day: 1.00    Years: 40.00    Pack years: 40.00    Types: Cigarettes  . Smokeless tobacco: Never Used  . Tobacco comment: Has a Jewel   Substance and Sexual Activity  . Alcohol use: Yes    Comment: socially  . Drug use: No  . Sexual activity: Not Currently    Partners: Male    Birth control/protection: Post-menopausal  Lifestyle  . Physical activity:    Days per week: Not on file    Minutes per session: Not on file  . Stress: Not on file  Relationships  . Social connections:    Talks on phone: Not on file    Gets together: Not on file    Attends religious service: Not on file    Active member of club or organization: Not on file    Attends meetings of clubs or organizations: Not on file    Relationship status: Not on file  . Intimate partner violence:    Fear of current or ex partner: Not on file    Emotionally abused: Not on file    Physically abused: Not on file    Forced sexual activity: Not on file  Other Topics Concern  . Not on file  Social History Narrative  . Not on file  Still smoking 1ppd trying to transition to e cigarettes  Family History   Problem Relation Age of Onset  . Heart disease Father   CABG and stents in father  Mother no family history of disease No diabetes in the family No cancer history other than skin cancer in father from [place]:   See Encounters Tab for problem based charting.  Patient discussed with Dr. Daryll Drown

## 2017-11-01 NOTE — Assessment & Plan Note (Signed)
Lab Results  Component Value Date   CHOL 253 (H) 10/27/2017   HDL 73 10/27/2017   LDLCALC 124 (H) 10/27/2017   TRIG 282 (H) 10/27/2017   CHOLHDL 3.5 10/27/2017   ASCVD risk is 6.1% today.  We discussed strategies to lower her cholesterol and she was given a diet plan.  She is hesitant to start any medication at this time.  I told her we can discuss at future visits and recheck her cholesterol after dietary changes.  Also discussed importance of quitting smoking

## 2017-11-01 NOTE — Assessment & Plan Note (Addendum)
Pt is here today to follow up a positive FIT test result.  She had a negative result on April 9th.  Due to being told her sample did not make it to the lab she had another FIT test a few weeks later that returned positive.  She denies any weight loss, constipation or other symptoms of concern.  -although this FIT test may be a false positive we cannot be certain, also the patient is now 57 years old could benefit from a colonoscopy -she will follow up with [place] for orange card approval and we will attempt to get a colonoscopy through a referral to a GI doc that will accept no insurance just orange card.

## 2017-11-08 ENCOUNTER — Telehealth (HOSPITAL_COMMUNITY): Payer: Self-pay | Admitting: *Deleted

## 2017-11-08 NOTE — Telephone Encounter (Signed)
Patient called and asked about Pap smear result. Let her know that her Pap smear was normal with negative HPV. Told her that her next Pap smear will be due in one year due to her previous Pap smear 12/31/2016 was abnormal. Patient verbalized understanding.

## 2017-11-08 NOTE — Telephone Encounter (Signed)
Health coaching 2  Labs- cholesterol 253, triglycerides 282, HDL 73, LDL 124, hemoglobin A1C 5.8, mean plasma glucose 119.76.  Patient is aware and states she understands these results.  Goals-Patient wants to increase her vegetable, fruit and water consumption.  She states she will increase her exercise regimen.  Navigation-Navigation:   Patient is aware of 1  more health coaching session and a follow up. Patient has an appointment with internal medicine regarding her labs.

## 2017-11-10 ENCOUNTER — Encounter (HOSPITAL_COMMUNITY): Payer: Self-pay | Admitting: *Deleted

## 2017-11-10 NOTE — Progress Notes (Signed)
Letter mailed to patient with negative pap smear results. HPV was negative. Next pap smear due in one year.

## 2017-11-19 ENCOUNTER — Other Ambulatory Visit: Payer: Self-pay | Admitting: Obstetrics and Gynecology

## 2017-11-19 DIAGNOSIS — Z1231 Encounter for screening mammogram for malignant neoplasm of breast: Secondary | ICD-10-CM

## 2017-12-06 ENCOUNTER — Ambulatory Visit: Payer: Self-pay

## 2017-12-06 ENCOUNTER — Encounter (INDEPENDENT_AMBULATORY_CARE_PROVIDER_SITE_OTHER): Payer: Self-pay

## 2018-01-06 ENCOUNTER — Inpatient Hospital Stay: Admission: RE | Admit: 2018-01-06 | Payer: No Typology Code available for payment source | Source: Ambulatory Visit

## 2018-01-06 ENCOUNTER — Ambulatory Visit (HOSPITAL_COMMUNITY): Payer: No Typology Code available for payment source

## 2018-01-11 ENCOUNTER — Telehealth: Payer: Self-pay | Admitting: Internal Medicine

## 2018-01-11 NOTE — Telephone Encounter (Signed)
Returned call to patient. She was wondering if she should be on cholesterol lowering med. Discussed dietary changes and quitting smoking as outlined in Provider's note from 11/01/2017. Patient has appt with new PCP on 02/03/2018 and will further discuss cholesterol at that time. Hubbard Hartshorn, RN, BSN

## 2018-01-11 NOTE — Telephone Encounter (Signed)
Pt is calling regarding lab results and new medicine; pt contact# [phone]

## 2018-02-03 ENCOUNTER — Ambulatory Visit (INDEPENDENT_AMBULATORY_CARE_PROVIDER_SITE_OTHER): Payer: Self-pay | Admitting: Internal Medicine

## 2018-02-03 ENCOUNTER — Encounter: Payer: Self-pay | Admitting: Internal Medicine

## 2018-02-03 ENCOUNTER — Other Ambulatory Visit: Payer: Self-pay

## 2018-02-03 VITALS — BP 120/85 | HR 96 | Temp 98.3°F | Ht 67.0 in | Wt 122.3 lb

## 2018-02-03 DIAGNOSIS — K0889 Other specified disorders of teeth and supporting structures: Secondary | ICD-10-CM

## 2018-02-03 DIAGNOSIS — R195 Other fecal abnormalities: Secondary | ICD-10-CM

## 2018-02-03 DIAGNOSIS — E782 Mixed hyperlipidemia: Secondary | ICD-10-CM

## 2018-02-03 DIAGNOSIS — F17200 Nicotine dependence, unspecified, uncomplicated: Secondary | ICD-10-CM

## 2018-02-03 DIAGNOSIS — H9193 Unspecified hearing loss, bilateral: Secondary | ICD-10-CM

## 2018-02-03 DIAGNOSIS — F17210 Nicotine dependence, cigarettes, uncomplicated: Secondary | ICD-10-CM

## 2018-02-03 DIAGNOSIS — Z8669 Personal history of other diseases of the nervous system and sense organs: Secondary | ICD-10-CM

## 2018-02-03 NOTE — Assessment & Plan Note (Signed)
ASCVD risk score 6.1. Discussed with patient what this means that tobacco cessation would help decrease her risk for heart disease. She is going to try to stop smoking and continue with other lifestyle modifications. Will recheck at follow-up visit in 3 months. If no significant changes, will start moderate intensity statin.

## 2018-02-03 NOTE — Assessment & Plan Note (Signed)
Patient endorses progressive hearing loss in both ears for several months. She has had bilateral inner ear procedures in the past at [facility] and would like to establish care with ENT here for evaluation of hearing loss. Placed referral.

## 2018-02-03 NOTE — Assessment & Plan Note (Signed)
Patient has smoked a pack per day since she was 57 years old. Discussed the importance of cessation and how it can significantly reduce her risk for heart disease and malignancy. She is amenable to try quitting with nicotine patches. Will continue to counsel at future visits.

## 2018-02-03 NOTE — Assessment & Plan Note (Signed)
Patient was able to obtain orange card so have placed referral to GI for colonoscopy.  Denies weight loss or changes in stools. No family history of colon cancer.

## 2018-02-03 NOTE — Patient Instructions (Addendum)
Michelle Nielsen, It was a pleasure meeting you today. I have placed referrals for ENT evaluation and gastroenterologist for colonoscopy. You will be called to set up these appointments.   Keep up the good work with your diet and active lifestyle.  We discussed trying to quit smoking with nicotine patches. I strongly encourage you to do this, as it will significantly decrease your chances of heart disease and risk of cancer.   Please call us if you have any questions or concerns. I will plan to see you back in 3 months.

## 2018-02-03 NOTE — Progress Notes (Signed)
   CC: positive FIT test   HPI:   Ms.Raya L Rutten is a 57 y.o. female who presents for follow-up on positive FIT test. She was able to obtain orange card since last visit and can now proceed with GI referral for colonoscopy.   She also is requesting ENT referral for decreased hearing. States she had recurrent ear infections and has required multiple procedures on her ears. Denies any current ear pain.   In regards to lifestyle, she has done well with her diet and eats mostly proteins, fruits and vegetables. She also stays active throughout the week. Continues to smoke a pack per day.    Past Medical History:  Diagnosis Date  . Abnormal mammogram of right breast    having biopsy 10/29/15  . Hearing loss   . Vaginal Pap smear, abnormal    Review of Systems:   Constitutional: denies fevers, chills, weight loss CV: no chest pain or palpitations Pulm: no shortness of breath GI: No abdominal pain, changes in stools   Physical Exam:  Vitals:   02/03/18 1536  BP: 120/85  Pulse: 96  Temp: 98.3 F (36.8 C)  TempSrc: Oral  SpO2: 99%  Weight: 122 lb 4.8 oz (55.5 kg)  Height: 5\' 7"  (1.702 m)   General: alert, pleasant female, appears stated age HEENT: TMs normal; poor dentition CV: RRR; no murmurs, rubs or gallops Pulm: normal respiratory effort; lungs CTA bilaterally GI: BS+; abdomen soft, non-tender Ext: no edema  Assessment & Plan:   See Encounters Tab for problem based charting.  Patient seen with Dr. Evette Doffing

## 2018-02-04 NOTE — Progress Notes (Signed)
Internal Medicine Clinic Attending  I saw and evaluated the patient.  I personally confirmed the key portions of the history and exam documented by Dr. Bloomfield and I reviewed pertinent patient test results.  The assessment, diagnosis, and plan were formulated together and I agree with the documentation in the resident's note.  

## 2018-02-11 ENCOUNTER — Ambulatory Visit (INDEPENDENT_AMBULATORY_CARE_PROVIDER_SITE_OTHER): Payer: Self-pay | Admitting: Physician Assistant

## 2018-02-11 ENCOUNTER — Ambulatory Visit: Payer: No Typology Code available for payment source | Admitting: Gastroenterology

## 2018-02-11 ENCOUNTER — Encounter: Payer: Self-pay | Admitting: Physician Assistant

## 2018-02-11 VITALS — BP 126/90 | HR 72 | Ht 67.0 in | Wt 126.8 lb

## 2018-02-11 DIAGNOSIS — R195 Other fecal abnormalities: Secondary | ICD-10-CM

## 2018-02-11 MED ORDER — NA SULFATE-K SULFATE-MG SULF 17.5-3.13-1.6 GM/177ML PO SOLN
1.0000 | ORAL | 0 refills | Status: DC
Start: 2018-02-11 — End: 2018-03-14

## 2018-02-11 NOTE — Progress Notes (Addendum)
Chief Complaint: Positive FIT test  HPI:    Michelle Nielsen is a 57 year old female with a past medical history as listed below, who was referred to me by Modena Nunnery D, DO for a complaint of positive fit test.    08/23/2017 fecal occult blood test positive.    Today, patient presents to clinic and explains that she is doing well but was just told that she had a positive blood test for her stool.  Explains that she does have a family history of colon cancer in her aunt on her mother side at around the age of 52.  Denies any GI complaints.    Denies fever, chills, weight loss, anorexia, nausea, vomiting, change in bowel habits, abdominal pain, heartburn or reflux.  Past Medical History:  Diagnosis Date  . Abnormal mammogram of right breast    having biopsy 10/29/15  . Hearing loss   . Vaginal Pap smear, abnormal     Past Surgical History:  Procedure Laterality Date  . BREAST BIOPSY Right 2017   benign  . CESAREAN SECTION    . INNER EAR SURGERY    . TONSILLECTOMY      No current outpatient medications on file.   No current facility-administered medications for this visit.     Allergies as of 02/11/2018 - Review Complete 02/11/2018  Allergen Reaction Noted  . Sulfonamide derivatives  02/21/2009    Family History  Problem Relation Age of Onset  . Heart disease Father     Social History   Socioeconomic History  . Marital status: Single    Spouse name: Not on file  . Number of children: Not on file  . Years of education: Not on file  . Highest education level: Not on file  Occupational History  . Not on file  Social Needs  . Financial resource strain: Not on file  . Food insecurity:    Worry: Not on file    Inability: Not on file  . Transportation needs:    Medical: Not on file    Non-medical: Not on file  Tobacco Use  . Smoking status: Current Every Day Smoker    Packs/day: 1.00    Years: 40.00    Pack years: 40.00    Types: Cigarettes  . Smokeless  tobacco: Never Used  Substance and Sexual Activity  . Alcohol use: Not Currently    Comment: socially  . Drug use: No  . Sexual activity: Not Currently    Partners: Male    Birth control/protection: Post-menopausal  Lifestyle  . Physical activity:    Days per week: Not on file    Minutes per session: Not on file  . Stress: Not on file  Relationships  . Social connections:    Talks on phone: Not on file    Gets together: Not on file    Attends religious service: Not on file    Active member of club or organization: Not on file    Attends meetings of clubs or organizations: Not on file    Relationship status: Not on file  . Intimate partner violence:    Fear of current or ex partner: Not on file    Emotionally abused: Not on file    Physically abused: Not on file    Forced sexual activity: Not on file  Other Topics Concern  . Not on file  Social History Narrative  . Not on file    Review of Systems:    Constitutional: No weight  loss, fever or chills Skin: No rash  Cardiovascular: No chest pain Respiratory: No SOB  Gastrointestinal: See HPI and otherwise negative Genitourinary: No dysuria  Neurological: No headache, dizziness or syncope Musculoskeletal: No new muscle or joint pain Hematologic: No bleeding Psychiatric: No history of depression or anxiety   Physical Exam:  Vital signs: BP 126/90   Pulse 72   Ht 5\' 7"  (1.702 m)   Wt 126 lb 12.8 oz (57.5 kg)   BMI 19.86 kg/m   Constitutional:   Pleasant Caucasian female appears to be in NAD, Well developed, Well nourished, alert and cooperative Head:  Normocephalic and atraumatic. Eyes:   PEERL, EOMI. No icterus. Conjunctiva pink. Ears:  Normal auditory acuity. Neck:  Supple Throat: Oral cavity and pharynx without inflammation, swelling or lesion.  Respiratory: Respirations even and unlabored. Lungs clear to auscultation bilaterally.   No wheezes, crackles, or rhonchi.  Cardiovascular: Normal S1, S2. No MRG.  Regular rate and rhythm. No peripheral edema, cyanosis or pallor.  Gastrointestinal:  Soft, nondistended, nontender. No rebound or guarding. Normal bowel sounds. No appreciable masses or hepatomegaly. Rectal:  Not performed.  Msk:  Symmetrical without gross deformities. Without edema, no deformity or joint abnormality.  Neurologic:  Alert and  oriented x4;  grossly normal neurologically.  Skin:   Dry and intact without significant lesions or rashes. Psychiatric: Demonstrates good judgement and reason without abnormal affect or behaviors.  See HPI for recent labs.  Assessment: 1. Positive FIT test: Positive fit test in April, no prior colonoscopy  Plan: 1.  Scheduled patient for a diagnostic colonoscopy with Dr. Hilarie Fredrickson in the [facility] due to positive FIT testing.  Did discuss risk, benefits, limitations and alternatives and the patient agrees to proceed. 2.  Patient to follow in clinic with Dr. Hilarie Fredrickson per recommendations after time of procedure.  Ellouise Newer, PA-C  Addendum: Reviewed and agree with assessment and management plan. Jerene Bears, MD   [facility] 02/11/2018, 10:27 AM  Cc: Bloomfield, [place] D, DO

## 2018-02-11 NOTE — Patient Instructions (Signed)

## 2018-03-11 ENCOUNTER — Other Ambulatory Visit: Payer: Self-pay | Admitting: Internal Medicine

## 2018-03-11 ENCOUNTER — Telehealth: Payer: Self-pay | Admitting: Internal Medicine

## 2018-03-11 NOTE — Telephone Encounter (Signed)
Pt said she is returning your call °

## 2018-03-11 NOTE — Telephone Encounter (Signed)
Patient advised that she may pick up suprep at the front desk.

## 2018-03-11 NOTE — Telephone Encounter (Signed)
Pt has the orange card, please send her medicine to [facility] Dept Suprep(the medicine you take before you have the colonscopy) ; pt contact [phone].Marland Kitchen   Pt is having procedure on Monday.

## 2018-03-11 NOTE — Telephone Encounter (Signed)
Pt's GI md sent this script, they do not carry the exact kind at [place], tried to call pt and no answer, vmail not set up

## 2018-03-14 ENCOUNTER — Encounter

## 2018-03-14 ENCOUNTER — Ambulatory Visit (AMBULATORY_SURGERY_CENTER): Payer: Self-pay | Admitting: Internal Medicine

## 2018-03-14 ENCOUNTER — Encounter: Payer: Self-pay | Admitting: Internal Medicine

## 2018-03-14 VITALS — BP 138/96 | HR 77 | Temp 97.7°F | Resp 16 | Ht 67.0 in | Wt 126.0 lb

## 2018-03-14 DIAGNOSIS — D123 Benign neoplasm of transverse colon: Secondary | ICD-10-CM

## 2018-03-14 DIAGNOSIS — K621 Rectal polyp: Secondary | ICD-10-CM

## 2018-03-14 DIAGNOSIS — D125 Benign neoplasm of sigmoid colon: Secondary | ICD-10-CM

## 2018-03-14 DIAGNOSIS — K620 Anal polyp: Secondary | ICD-10-CM

## 2018-03-14 DIAGNOSIS — K635 Polyp of colon: Secondary | ICD-10-CM

## 2018-03-14 DIAGNOSIS — D129 Benign neoplasm of anus and anal canal: Secondary | ICD-10-CM

## 2018-03-14 DIAGNOSIS — R195 Other fecal abnormalities: Secondary | ICD-10-CM

## 2018-03-14 DIAGNOSIS — D128 Benign neoplasm of rectum: Secondary | ICD-10-CM

## 2018-03-14 MED ORDER — SODIUM CHLORIDE 0.9 % IV SOLN: 500.0000 mL | Freq: Once | INTRAVENOUS | Status: DC

## 2018-03-14 NOTE — Op Note (Signed)
[name] Procedure Date: 03/14/2018 4:31 PM MRN: [id number] Endoscopist: Jerene Bears , MD Age: 57 Referring MD:  Date of Birth: November 10, 1960 Gender: Female Account #: [id number] Procedure:                Colonoscopy Indications:              This is the patient's first colonoscopy, Positive                            fecal immunochemical test Medicines:                Monitored Anesthesia Care Procedure:                Pre-Anesthesia Assessment:                           - Prior to the procedure, a History and Physical                            was performed, and patient medications and                            allergies were reviewed. The patient's tolerance of                            previous anesthesia was also reviewed. The risks                            and benefits of the procedure and the sedation                            options and risks were discussed with the patient.                            All questions were answered, and informed consent                            was obtained. Prior Anticoagulants: The patient has                            taken no previous anticoagulant or antiplatelet                            agents. ASA Grade Assessment: II - A patient with                            mild systemic disease. After reviewing the risks                            and benefits, the patient was deemed in                            satisfactory condition to undergo the procedure.  After obtaining informed consent, the colonoscope                            was passed under direct vision. Throughout the                            procedure, the patient's blood pressure, pulse, and                            oxygen saturations were monitored continuously. The                            Model PCF-H190DL [id number]) scope was introduced                            through the anus and advanced to  the cecum,                            identified by appendiceal orifice and ileocecal                            valve. The colonoscopy was performed without                            difficulty. The patient tolerated the procedure                            well. The quality of the bowel preparation was                            good. The ileocecal valve, appendiceal orifice, and                            rectum were photographed. Scope In: 4:47:48 PM Scope Out: 5:14:51 PM Scope Withdrawal Time: 0 hours 22 minutes 25 seconds  Total Procedure Duration: 0 hours 27 minutes 3 seconds  Findings:                 The digital rectal exam was normal.                           A 30 mm polyp was found in the transverse colon.                            The polyp was sessile and laterally spreading. This                            was biopsied with a cold forceps for histology.                            Area distal to the polyp was tattooed with an                            injection of 3 mL of Spot (  carbon black).                           Two sessile polyps were found in the sigmoid colon.                            The polyps were 5 to 6 mm in size. These polyps                            were removed with a cold snare. Resection and                            retrieval were complete.                           A 20 mm polyp was found in the rectum. The polyp                            was pedunculated. The polyp was removed with a hot                            snare. Resection and retrieval were complete.                           A small polyp was found in the anus. The polyp was                            sessile. This was biopsied with a cold forceps for                            histology to exclude AIN.                           Multiple small-mouthed diverticula were found in                            the sigmoid colon.                           No additional abnormalities were  found on                            retroflexion. Complications:            No immediate complications. Estimated Blood Loss:     Estimated blood loss was minimal. Impression:               - One 30 mm polyp in the transverse colon.                            Biopsied. Tattooed.                           - Two 5 to 6 mm polyps in the sigmoid colon,  removed with a cold snare. Resected and retrieved.                           - One 20 mm polyp in the rectum, removed with a hot                            snare. Resected and retrieved.                           - One small polyp at the anus. Biopsied.                           - Diverticulosis in the sigmoid colon. Recommendation:           - Patient has a contact number available for                            emergencies. The signs and symptoms of potential                            delayed complications were discussed with the                            patient. Return to normal activities tomorrow.                            Written discharge instructions were provided to the                            patient.                           - Resume previous diet.                           - Continue present medications.                           - No aspirin, ibuprofen, naproxen, or other                            non-steroidal anti-inflammatory drugs for 2 weeks                            after polyp removal.                           - Await pathology results.                           - Repeat colonoscopy is recommended with EMR in the                            outpatient hospital setting for polypectomy. The  colonoscopy date will be determined after pathology                            results from today's exam become available for                            review. Jerene Bears, MD 03/14/2018 5:21:30 PM This report has been signed electronically.

## 2018-03-14 NOTE — Progress Notes (Signed)
Pt's states no medical or surgical changes since previsit or office visit. 

## 2018-03-14 NOTE — Progress Notes (Signed)
Called to room to assist during endoscopic procedure.  Patient ID and intended procedure confirmed with present staff. Received instructions for my participation in the procedure from the performing physician.  

## 2018-03-14 NOTE — Progress Notes (Signed)
A/ox3 pleased with MAC, report to RN 

## 2018-03-14 NOTE — Patient Instructions (Signed)
Continue present medications. No aspirin, ibuprofen, naproxen, or othernon-steriodal anti-inflammatory drugs for 2 weeks. Await pathology results. Please read handouts on diverticulosis and polyps.     YOU HAD AN ENDOSCOPIC PROCEDURE TODAY AT [facility]:   Refer to the procedure report that was given to you for any specific questions about what was found during the examination.  If the procedure report does not answer your questions, please call your gastroenterologist to clarify.  If you requested that your care partner not be given the details of your procedure findings, then the procedure report has been included in a sealed envelope for you to review at your convenience later.  YOU SHOULD EXPECT: Some feelings of bloating in the abdomen. Passage of more gas than usual.  Walking can help get rid of the air that was put into your GI tract during the procedure and reduce the bloating. If you had a lower endoscopy (such as a colonoscopy or flexible sigmoidoscopy) you may notice spotting of blood in your stool or on the toilet paper. If you underwent a bowel prep for your procedure, you may not have a normal bowel movement for a few days.  Please Note:  You might notice some irritation and congestion in your nose or some drainage.  This is from the oxygen used during your procedure.  There is no need for concern and it should clear up in a day or so.  SYMPTOMS TO REPORT IMMEDIATELY:   Following lower endoscopy (colonoscopy or flexible sigmoidoscopy):  Excessive amounts of blood in the stool  Significant tenderness or worsening of abdominal pains  Swelling of the abdomen that is new, acute  Fever of 100F or higher    For urgent or emergent issues, a gastroenterologist can be reached at any hour by calling [phone].   DIET:  We do recommend a small meal at first, but then you may proceed to your regular diet.  Drink plenty of fluids but you should avoid alcoholic  beverages for 24 hours.  ACTIVITY:  You should plan to take it easy for the rest of today and you should NOT DRIVE or use heavy machinery until tomorrow (because of the sedation medicines used during the test).    FOLLOW UP: Our staff will call the number listed on your records the next business day following your procedure to check on you and address any questions or concerns that you may have regarding the information given to you following your procedure. If we do not reach you, we will leave a message.  However, if you are feeling well and you are not experiencing any problems, there is no need to return our call.  We will assume that you have returned to your regular daily activities without incident.  If any biopsies were taken you will be contacted by phone or by letter within the next 1-3 weeks.  Please call us at [phone] if you have not heard about the biopsies in 3 weeks.    SIGNATURES/CONFIDENTIALITY: You and/or your care partner have signed paperwork which will be entered into your electronic medical record.  These signatures attest to the fact that that the information above on your After Visit Summary has been reviewed and is understood.  Full responsibility of the confidentiality of this discharge information lies with you and/or your care-partner.

## 2018-03-15 ENCOUNTER — Encounter (HOSPITAL_COMMUNITY): Payer: Self-pay

## 2018-03-15 ENCOUNTER — Ambulatory Visit (HOSPITAL_COMMUNITY)
Admission: RE | Admit: 2018-03-15 | Discharge: 2018-03-15 | Disposition: A | Discharge status: Home / Self Care | Payer: No Typology Code available for payment source | Source: Ambulatory Visit | Attending: Obstetrics and Gynecology | Admitting: Obstetrics and Gynecology

## 2018-03-15 ENCOUNTER — Ambulatory Visit
Admission: RE | Admit: 2018-03-15 | Discharge: 2018-03-15 | Disposition: A | Discharge status: Home / Self Care | Payer: No Typology Code available for payment source | Source: Ambulatory Visit | Attending: Obstetrics and Gynecology | Admitting: Obstetrics and Gynecology

## 2018-03-15 ENCOUNTER — Telehealth: Payer: Self-pay

## 2018-03-15 VITALS — BP 114/76 | Wt 125.0 lb

## 2018-03-15 DIAGNOSIS — Z1231 Encounter for screening mammogram for malignant neoplasm of breast: Secondary | ICD-10-CM

## 2018-03-15 DIAGNOSIS — Z1239 Encounter for other screening for malignant neoplasm of breast: Secondary | ICD-10-CM

## 2018-03-15 NOTE — Telephone Encounter (Signed)
  Follow up Call-  Call back number 03/14/2018 03/14/2018  Post procedure Call Back phone  # [phone] -  Permission to leave phone message No Yes  comments phone not set up -  Some recent data might be hidden     Patient questions:  Do you have a fever, pain , or abdominal swelling? No. Pain Score  0 *  Have you tolerated food without any problems? Yes.    Have you been able to return to your normal activities? Yes.    Do you have any questions about your discharge instructions: Diet   No. Medications  No. Follow up visit  No.  Do you have questions or concerns about your Care? No.  Actions: * If pain score is 4 or above: No action needed, pain <4.

## 2018-03-15 NOTE — Progress Notes (Signed)
No complaints today.   Pap Smear: Pap smear not completed today. Last Pap smear was 10/26/2017 in [facility] and normal with negative HPV. Patient had a colposcopy completed 01/27/2017 that was inadequate and a 51-month follow-up Pap smear recommended. Patient has a history of an abnormal Pap smear 12/31/2016 at [facility] and [facility] with positive HPV and 08/13/2015 at [name] and ASC[facility] with positive HPV.Patient had a colposcopy to follow up for the abnormal Pap smear on 01/27/2017 that was benign and 10/14/2015 that showed squamous dysplasia and a LEEP 10/25/2015 that showed CIN II. Last three Pap smears, two colposcopies, and LEEP results are in EPIC.   Physical exam: Breasts Breasts symmetrical. No skin abnormalities bilateral breasts. No nipple retraction bilateral breasts. No nipple discharge bilateral breasts. No lymphadenopathy. No lumps palpated bilateral breasts. No complaints of pain or tenderness on exam. Referred patient to the [facility] for a screening mammogram. Appointment scheduled for Tuesday, March 15, 2018 at 1250.        Pelvic/Bimanual No Pap smear completed today since last Pap smear and HPV typing was 6/125/2019. Pap smear not indicated per BCCCP guidelines.   Smoking History: Patient is a current smoker. Discussed smoking cessation with patient. Referred to the [facility] Quitline and gave resources to free smoking cessation classes at [facility].  Patient Navigation: Patient education provided. Access to services provided for patient through [place] program.    Colorectal Cancer Screening: Patient had a colonoscopy completed 03/14/2018. FIT Test completed 08/23/2017 that was positive. No complaints today.   Breast and Cervical Cancer Risk Assessment: Patient has no family history of breast cancer, known genetic mutations, or radiation treatment to the chest before age 10. Patient has a history of cervical dysplasia. Patient has no  history of being immunocompromised or DES exposure in-utero.  Risk Assessment    Risk Scores      03/15/2018 10/26/2017   Last edited by: [name], LPN Rolena Infante H, LPN   5-year risk: 1.4 % 1.4 %   Lifetime risk: 7.6 % 7.6 %

## 2018-03-15 NOTE — Patient Instructions (Addendum)
Explained breast self awareness with Michelle Nielsen. Patient did not need a Pap smear today due to last Pap smear and HPV typing was 10/26/2017. Let patient know that her next Pap smear is due in June 2020 due to her history of abnormal Pap smears. Referred patient to the [facility] for a screening mammogram. Appointment scheduled for Tuesday, March 15, 2018 at 1250. Patient aware of appointment and will be there. Let patient know the [facility] will follow up with her within the next couple weeks with results of mammogram by letter or phone Discussed smoking cessation with patient. Referred to the [facility] Quitline and gave resources to free smoking cessation classes at [facility]. [name] verbalized understanding.  Christana Angelica, Arvil Chaco, RN 2:10 PM

## 2018-03-17 ENCOUNTER — Other Ambulatory Visit: Payer: Self-pay | Admitting: Obstetrics and Gynecology

## 2018-03-17 DIAGNOSIS — R928 Other abnormal and inconclusive findings on diagnostic imaging of breast: Secondary | ICD-10-CM

## 2018-03-18 ENCOUNTER — Ambulatory Visit
Admission: RE | Admit: 2018-03-18 | Discharge: 2018-03-18 | Disposition: A | Discharge status: Home / Self Care | Payer: No Typology Code available for payment source | Source: Ambulatory Visit | Attending: Obstetrics and Gynecology | Admitting: Obstetrics and Gynecology

## 2018-03-18 DIAGNOSIS — R928 Other abnormal and inconclusive findings on diagnostic imaging of breast: Secondary | ICD-10-CM

## 2018-03-21 ENCOUNTER — Encounter: Payer: Self-pay | Admitting: Internal Medicine

## 2018-03-23 ENCOUNTER — Other Ambulatory Visit: Payer: Self-pay | Admitting: Internal Medicine

## 2018-03-23 ENCOUNTER — Encounter (HOSPITAL_COMMUNITY): Payer: Self-pay | Admitting: *Deleted

## 2018-03-23 DIAGNOSIS — D123 Benign neoplasm of transverse colon: Secondary | ICD-10-CM

## 2018-03-24 ENCOUNTER — Telehealth: Payer: Self-pay | Admitting: Internal Medicine

## 2018-03-24 NOTE — Telephone Encounter (Signed)
See 03/14/18 surgical pathology result notes for further correspondence related to this.

## 2018-03-24 NOTE — Telephone Encounter (Signed)
Patient returning call to [facility] about results from 11.11.19.

## 2018-04-01 ENCOUNTER — Telehealth: Payer: Self-pay

## 2018-04-01 NOTE — Telephone Encounter (Signed)
Health Coaching 3  New Goal:  Patient states that she is currently eating 1 cup of fruit and 1 cup of vegetables/day.  Patient states that she would like to increase her fruit and vegetable intake to 3 cups of each per day.  Patient states that she will plan to reach that goal in one week.  Patient states that she would like to increase her physical activity by kayaking.  Patient states her goal is to kayak for one hour, five times per week.  Patient states that her timeframe for achieving her kayaking goal is one month.  Patient states that she will be working with BCCCP in January to stop smoking.  Confidence Level for Achieving Goals:  Patient states that on a scale of 1-10 with 1 being the lowest and 10 the highest, her confidence level in achieving her goals for increasing fruit and vegetable intake and physical activity by kayaking is a 7.  Barrier to reaching goal:  Patient states that being on the run is a possible barrier to eating more fruits and vegetables.  Patient states that there is no barrier to kayaking.  Strategies to overcome barriers:  Patient states that she will buy raw fruits and vegetables like apples, oranges, and carrots that she can take with her when she is on the go.  Navigation:  Patient is aware of a follow-up session and said that anytime is good for [place] to call.  Time:  15 minutes

## 2018-04-07 ENCOUNTER — Telehealth: Payer: Self-pay | Admitting: *Deleted

## 2018-04-07 NOTE — Telephone Encounter (Signed)
See 03/14/18 surgical pathology notes for more detailed information...  I contacted [facility] again and was sent to a voicemail for the new patient coordinator as there is still no appointment for patient in the system. I have left a message that we are requesting a call back with appointment information as soon as possible.

## 2018-04-07 NOTE — Telephone Encounter (Signed)
-----   Message from Larina Bras, [place] sent at 03/24/2018  3:16 PM EST ----- Did we get [facility] to respond with appt for AIN anal polyp?

## 2018-04-08 NOTE — Telephone Encounter (Signed)
I have gotten a call from Seth Bake with [facility] who states that she will be in contact with infectious disease regarding referral as they send all AIN polyps to infectious disease first. She had already sent the referral back to them.

## 2018-04-13 ENCOUNTER — Ambulatory Visit: Payer: No Typology Code available for payment source

## 2018-04-14 NOTE — Telephone Encounter (Signed)
I never received a call back from Seth Bake (as referred to in my last update). I called [facility] again for update. I was told that a referral was placed to infectious disease on 03/26/18, no further explanation was given. I contacted infectious disease at phone [phone] and was told that the referral had been "closed." I asked why the referral had been closed and was told that she did not know and could not find explaination. I asked if the referral could be reopened since we had not asked for referral to be closed and patient still needs appointment. Staff member indicates that she is unable to reopen referral and states that we must resend all of our paperwork back to the office for it to be re-reviewed. She gave me a fax of [phone]. I did explain that we have called multiple times to find out the status of referral and have really been given the run around but that I will fax information again. Information has been faxed to infectious disease. I will call again for status update. (see surgical pathology result note from 03/14/18 for further correspondence regarding this referral).

## 2018-04-20 ENCOUNTER — Ambulatory Visit: Payer: Self-pay

## 2018-04-22 NOTE — Telephone Encounter (Signed)
After contacting [facility] on multiple occasions and getting no response and no appointment, Dr Hilarie Fredrickson has asked that we contact [facility] again to see if they would be able to see patient in January as sometimes they can see patients with financial hardship earlier in the year with a special fund they have set up just for that. I contacted [facility] and am now told that they ARE able to see patient's with orange card but they have to be referred by [facility] rather than [place]. I have left a voicemail at [employer] to ask that they call us back so I can get referral going asap.

## 2018-04-29 ENCOUNTER — Other Ambulatory Visit: Payer: Self-pay

## 2018-04-29 ENCOUNTER — Encounter (HOSPITAL_COMMUNITY): Payer: Self-pay

## 2018-05-09 ENCOUNTER — Other Ambulatory Visit: Payer: Self-pay

## 2018-05-09 ENCOUNTER — Encounter (HOSPITAL_COMMUNITY)
Admission: RE | Disposition: A | Discharge status: Home / Self Care | Payer: Self-pay | Source: Home / Self Care | Attending: Internal Medicine

## 2018-05-09 ENCOUNTER — Ambulatory Visit (HOSPITAL_COMMUNITY): Payer: Self-pay | Admitting: Certified Registered Nurse Anesthetist

## 2018-05-09 ENCOUNTER — Ambulatory Visit (HOSPITAL_COMMUNITY)
Admission: RE | Admit: 2018-05-09 | Discharge: 2018-05-09 | Disposition: A | Discharge status: Home / Self Care | Payer: Self-pay | Attending: Internal Medicine | Admitting: Internal Medicine

## 2018-05-09 ENCOUNTER — Encounter (HOSPITAL_COMMUNITY): Payer: Self-pay | Admitting: *Deleted

## 2018-05-09 DIAGNOSIS — Z86010 Personal history of colonic polyps: Secondary | ICD-10-CM | POA: Insufficient documentation

## 2018-05-09 DIAGNOSIS — D123 Benign neoplasm of transverse colon: Secondary | ICD-10-CM | POA: Insufficient documentation

## 2018-05-09 DIAGNOSIS — D126 Benign neoplasm of colon, unspecified: Secondary | ICD-10-CM

## 2018-05-09 DIAGNOSIS — F17210 Nicotine dependence, cigarettes, uncomplicated: Secondary | ICD-10-CM | POA: Insufficient documentation

## 2018-05-09 DIAGNOSIS — Z882 Allergy status to sulfonamides status: Secondary | ICD-10-CM | POA: Insufficient documentation

## 2018-05-09 HISTORY — PX: COLONOSCOPY WITH PROPOFOL: SHX5780

## 2018-05-09 SURGERY — COLONOSCOPY WITH PROPOFOL
Anesthesia: Monitor Anesthesia Care

## 2018-05-09 MED ORDER — SPOT INK MARKER SYRINGE KIT
PACK | SUBMUCOSAL | Status: DC | PRN
  Administered 2018-05-09: 3.5 mL via SUBMUCOSAL

## 2018-05-09 MED ORDER — SODIUM CHLORIDE 0.9 % IV SOLN: INTRAVENOUS | Status: DC

## 2018-05-09 MED ORDER — LACTATED RINGERS IV SOLN
INTRAVENOUS | Status: DC
  Administered 2018-05-09: 07:00:00 via INTRAVENOUS

## 2018-05-09 MED ORDER — LIDOCAINE 2% (20 MG/ML) 5 ML SYRINGE
INTRAMUSCULAR | Status: DC | PRN
  Administered 2018-05-09: 100 mg via INTRAVENOUS
  Administered 2018-05-09: 50 mg via INTRAVENOUS

## 2018-05-09 MED ORDER — PROPOFOL 10 MG/ML IV BOLUS
INTRAVENOUS | Status: AC
  Filled 2018-05-09: qty 20

## 2018-05-09 MED ORDER — PROPOFOL 10 MG/ML IV BOLUS
INTRAVENOUS | Status: AC
  Filled 2018-05-09: qty 40

## 2018-05-09 MED ORDER — SPOT INK MARKER SYRINGE KIT
PACK | SUBMUCOSAL | Status: AC
  Filled 2018-05-09: qty 10

## 2018-05-09 MED ORDER — PROPOFOL 10 MG/ML IV BOLUS
INTRAVENOUS | Status: DC | PRN
  Administered 2018-05-09: 50 mg via INTRAVENOUS
  Administered 2018-05-09: 20 mg via INTRAVENOUS
  Administered 2018-05-09: 30 mg via INTRAVENOUS

## 2018-05-09 MED ORDER — PROPOFOL 500 MG/50ML IV EMUL
INTRAVENOUS | Status: DC | PRN
  Administered 2018-05-09: 150 ug/kg/min via INTRAVENOUS

## 2018-05-09 SURGICAL SUPPLY — 22 items

## 2018-05-09 NOTE — Anesthesia Postprocedure Evaluation (Signed)
Anesthesia Post Note  Patient: Michelle Nielsen  Procedure(s) Performed: COLONOSCOPY WITH PROPOFOL (N/A ) ENDOSCOPIC MUCOSAL RESECTION (N/A ) SCHLEROTHERAPY  Lifter POLYPECTOMY SUBMUCOSAL TATTOO INJECTION     Patient location during evaluation: PACU Anesthesia Type: MAC Level of consciousness: awake and alert Pain management: pain level controlled Vital Signs Assessment: post-procedure vital signs reviewed and stable Respiratory status: spontaneous breathing, nonlabored ventilation, respiratory function stable and patient connected to nasal cannula oxygen Cardiovascular status: stable and blood pressure returned to baseline Postop Assessment: no apparent nausea or vomiting Anesthetic complications: no    Last Vitals:  Vitals:   05/09/18 0955 05/09/18 1000  BP:  130/78  Pulse: 92 87  Resp:  19  Temp: 36.4 C   SpO2:  100%    Last Pain:  Vitals:   05/09/18 0955  TempSrc: Oral  PainSc:                  [name] COKER

## 2018-05-09 NOTE — Discharge Instructions (Signed)

## 2018-05-09 NOTE — Anesthesia Preprocedure Evaluation (Addendum)
Anesthesia Evaluation  Patient identified by MRN, date of birth, ID band Patient awake    Reviewed: Allergy & Precautions, NPO status , Patient's Chart, lab work & pertinent test results  Airway Mallampati: II  TM Distance: >3 FB Neck ROM: Full    Dental  (+) Teeth Intact, Partial Upper, Dental Advisory Given   Pulmonary Current Smoker,    breath sounds clear to auscultation       Cardiovascular  Rhythm:Regular Rate:Normal     Neuro/Psych    GI/Hepatic   Endo/Other    Renal/GU      Musculoskeletal   Abdominal   Peds  Hematology   Anesthesia Other Findings   Reproductive/Obstetrics                            Anesthesia Physical Anesthesia Plan  ASA: II  Anesthesia Plan: MAC   Post-op Pain Management:    Induction: Intravenous  PONV Risk Score and Plan: Ondansetron  Airway Management Planned: Natural Airway and Simple Face Mask  Additional Equipment:   Intra-op Plan:   Post-operative Plan:   Informed Consent: I have reviewed the patients History and Physical, chart, labs and discussed the procedure including the risks, benefits and alternatives for the proposed anesthesia with the patient or authorized representative who has indicated his/her understanding and acceptance.   Dental advisory given  Plan Discussed with: CRNA and Anesthesiologist  Anesthesia Plan Comments:         Anesthesia Quick Evaluation

## 2018-05-09 NOTE — Transfer of Care (Signed)
Immediate Anesthesia Transfer of Care Note  Patient: Michelle Nielsen  Procedure(s) Performed: COLONOSCOPY WITH PROPOFOL (N/A ) ENDOSCOPIC MUCOSAL RESECTION (N/A ) SCHLEROTHERAPY  Lifter POLYPECTOMY SUBMUCOSAL TATTOO INJECTION  Patient Location: PACU  Anesthesia Type:MAC  Level of Consciousness: awake, alert  and oriented  Airway & Oxygen Therapy: Patient Spontanous Breathing and Patient connected to face mask oxygen  Post-op Assessment: Report given to RN and Post -op Vital signs reviewed and stable  Post vital signs: Reviewed and stable  Last Vitals:  Vitals Value Taken Time  BP    Temp    Pulse 91 05/09/2018  9:53 AM  Resp    SpO2 100 % 05/09/2018  9:53 AM  Vitals shown include unvalidated device data.  Last Pain:  Vitals:   05/09/18 0650  TempSrc: Oral  PainSc: 0-No pain         Complications: No apparent anesthesia complications

## 2018-05-09 NOTE — Telephone Encounter (Signed)
I have contacted Michelle Nielsen at [facility] who states that she will send a note to the scheduling nurse to open our referral back up since there is no explaination as to why it was closed.

## 2018-05-09 NOTE — Op Note (Addendum)
[facility] Patient Name: Michelle Nielsen Procedure Date: 05/09/2018 MRN: [id number] Attending MD: Jerene Bears , MD Date of Birth: [birth year] CSN: [id number] Age: 58 Admit Type: Outpatient Procedure:                Colonoscopy Indications:              Therapeutic procedure for known colon adenoma seen                            at colonoscopy in November 2019 Providers:                Lajuan Lines. Hilarie Fredrickson, MD, Burtis Junes, RN, Dorise Hiss, RN,                            Charolette Child, Technician, [facility], CRNA Referring MD:             Nicholes Calamity. [place], DO Medicines:                Monitored Anesthesia Care Complications:            No immediate complications. Estimated Blood Loss:     Estimated blood loss: none. Procedure:                Pre-Anesthesia Assessment:                           - Prior to the procedure, a History and Physical                            was performed, and patient medications and                            allergies were reviewed. The patient's tolerance of                            previous anesthesia was also reviewed. The risks                            and benefits of the procedure and the sedation                            options and risks were discussed with the patient.                            All questions were answered, and informed consent                            was obtained. Prior Anticoagulants: The patient has                            taken no previous anticoagulant or antiplatelet                            agents. ASA Grade Assessment: II - A patient with  mild systemic disease. After reviewing the risks                            and benefits, the patient was deemed in                            satisfactory condition to undergo the procedure.                           After obtaining informed consent, the colonoscope                            was passed under direct vision.  Throughout the                            procedure, the patient's blood pressure, pulse, and                            oxygen saturations were monitored continuously. The                            PCF-H190DL ([id number]) Olympus peds colonoscope was                            introduced through the anus and advanced to the                            cecum, identified by appendiceal orifice and                            ileocecal valve. The colonoscopy was performed                            without difficulty. The patient tolerated the                            procedure well. The quality of the bowel                            preparation was good. Scope In: 8:40:11 AM Scope Out: 9:45:54 AM Scope Withdrawal Time: 1 hour 1 minute 32 seconds  Total Procedure Duration: 1 hour 5 minutes 43 seconds  Findings:      The digital rectal exam was normal.      A 30 mm polyp was found in the transverse colon. The polyp was       carpet-like. Preparations were made for mucosal resection. Orise was       injected with adequate lift of the lesion from the muscularis propria       (10.5 cc total). Snare mucosal resection with suction (via the working       channel) retrieval was performed. A 35 mm area was resected. Five pieces       were resected in total. Resection and retrieval were complete. There was       no bleeding during and at the end of the procedure. To close a  defect       after mucosal resection, two hemostatic clips were successfully placed.       Additional clips to completely close the defect were planned/attempted,       but due to colonic spasm and inability to seat clip completely across       defect additional clipping not performed. Area distal was tattooed with       total injection 4 mL of Spot (carbon black) on opposite walls.      The exam was otherwise unremarkable throughout the examined colon. Impression:               - One 30 mm polyp in the transverse colon, removed                             with mucosal resection. Resected and retrieved.                            Clips were placed. Tattooed.                           - Mucosal resection was performed. Resection and                            retrieval were complete. Moderate Sedation:      N/A Recommendation:           - Patient has a contact number available for                            emergencies. The signs and symptoms of potential                            delayed complications were discussed with the                            patient. Return to normal activities tomorrow.                            Written discharge instructions were provided to the                            patient.                           - Resume previous diet.                           - Continue present medications.                           - No aspirin, ibuprofen, naproxen, or other                            non-steroidal anti-inflammatory drugs for 4 weeks                            after polyp removal.                           -  Await pathology results.                           - Repeat colonoscopy in 6 months to review the                            polypectomy site.                           - Surgical consult at [facility] or [facility] for known AIN-1. Procedure Code(s):        --- Professional ---                           [phone], Colonoscopy, flexible; with endoscopic                            mucosal resection Diagnosis Code(s):        --- Professional ---                           D12.3, Benign neoplasm of transverse colon (hepatic                            flexure or splenic flexure)                           D12.6, Benign neoplasm of colon, unspecified CPT copyright 2018 American Medical Association. All rights reserved. The codes documented in this report are preliminary and upon coder review may  be revised to meet current compliance requirements. Jerene Bears, MD 05/09/2018 10:15:36 AM This report has  been signed electronically. Number of Addenda: 0

## 2018-05-09 NOTE — H&P (Signed)
HPI: Michelle Nielsen is a 58 year old female with a history of adenomatous colon polyps, AIN, cervical dysplasia who presents for outpatient colonoscopy.  She is here today with her daughter.  She had a positive fit test and then had colonoscopy performed in November 2019.  She had multiple adenomatous colon polyps removed and a large flat, laterally spreading transverse colon adenoma which was not removed at that time.  She returns today to undergo EMR in the outpatient hospital setting.  She also had an anal polyp found to have anal intraepithelial neoplasia I and is awaiting to see surgery.  This is been somewhat difficult given her lack of medical insurance.  She has been referred to [facility] but appointment remains pending  No recent rectal bleeding, abdominal pain, nausea or vomiting  Past Medical History:  Diagnosis Date  . Abnormal mammogram of right breast    having biopsy 10/29/15  . Hearing loss   . Vaginal Pap smear, abnormal     Past Surgical History:  Procedure Laterality Date  . BREAST BIOPSY Right 2017   benign  . CESAREAN SECTION    . INNER EAR SURGERY    . LEEP  2017   Everything is fine now.  . TONSILLECTOMY      (Not in an outpatient encounter)   Allergies  Allergen Reactions  . Sulfonamide Derivatives     ulcers in mouth    Family History  Problem Relation Age of Onset  . Heart disease Father   . Colon cancer Maternal Aunt     Social History   Tobacco Use  . Smoking status: Current Every Day Smoker    Packs/day: 1.00    Years: 40.00    Pack years: 40.00    Types: Cigarettes  . Smokeless tobacco: Never Used  Substance Use Topics  . Alcohol use: Not Currently    Comment: socially  . Drug use: No    ROS: As per history of present illness, otherwise negative  BP (!) 145/96   Pulse 88   Temp 98.2 F (36.8 C) (Oral)   Resp 20   Ht 5\' 7"  (1.702 m)   Wt 56.7 kg   SpO2 99%   BMI 19.58 kg/m  Gen: awake, alert,  NAD HEENT: anicteric, op clear CV: RRR, no mrg Pulm: CTA b/l Abd: soft, NT/ND, +BS throughout Ext: no c/c/e Neuro: nonfocal    ASSESSMENT/PLAN: 58 year old female with a history of adenomatous colon polyps, AIN, cervical dysplasia who presents for outpatient colonoscopy.   1.  Laterally spreading transverse colon adenoma --for colonoscopy today with EMR.  We discussed the higher than average risk particularly for immediate and delayed post polypectomy bleeding and perforation. .The nature of the procedure, as well as the risks, benefits, and alternatives were carefully and thoroughly reviewed with the patient. Ample time for discussion and questions allowed. The patient understood, was satisfied, and agreed to proceed.  She was advised the need to avoid NSAIDs and aspirin for at least 4 weeks after this procedure  2.  AIN-1 --we will continue to work for surgical consultation to have this lesion ablated     Cc:No referring provider defined for this encounter.

## 2018-05-10 ENCOUNTER — Encounter: Payer: Self-pay | Admitting: Internal Medicine

## 2018-05-10 NOTE — Telephone Encounter (Signed)
Per Evlyn Clines at Infectious Disease, she has sent information to Dr again for review and is awaiting response. She will call me with answer one way or another.

## 2018-05-11 ENCOUNTER — Encounter (HOSPITAL_COMMUNITY): Payer: Self-pay | Admitting: Internal Medicine

## 2018-05-13 NOTE — Telephone Encounter (Signed)
Patient is scheduled to see Infectious Disease on 06/09/2018 at [facility]. Patient states that she has been made aware of this appointment.

## 2018-06-14 ENCOUNTER — Telehealth: Payer: Self-pay | Admitting: Internal Medicine

## 2018-06-14 NOTE — Telephone Encounter (Signed)
All questions answered about pathology letters and results. She will call back for any additional questions or concerns

## 2018-06-20 ENCOUNTER — Encounter: Payer: Self-pay | Admitting: Internal Medicine

## 2018-06-20 ENCOUNTER — Ambulatory Visit (INDEPENDENT_AMBULATORY_CARE_PROVIDER_SITE_OTHER): Payer: Self-pay | Admitting: Internal Medicine

## 2018-06-20 ENCOUNTER — Other Ambulatory Visit: Payer: Self-pay

## 2018-06-20 VITALS — BP 133/88 | HR 103 | Temp 98.9°F | Ht 67.0 in | Wt 122.9 lb

## 2018-06-20 DIAGNOSIS — F32A Depression, unspecified: Secondary | ICD-10-CM

## 2018-06-20 DIAGNOSIS — Z86010 Personal history of colonic polyps: Secondary | ICD-10-CM

## 2018-06-20 DIAGNOSIS — R82998 Other abnormal findings in urine: Secondary | ICD-10-CM

## 2018-06-20 DIAGNOSIS — Z720 Tobacco use: Secondary | ICD-10-CM

## 2018-06-20 DIAGNOSIS — K029 Dental caries, unspecified: Secondary | ICD-10-CM

## 2018-06-20 DIAGNOSIS — F329 Major depressive disorder, single episode, unspecified: Secondary | ICD-10-CM

## 2018-06-20 DIAGNOSIS — E785 Hyperlipidemia, unspecified: Secondary | ICD-10-CM

## 2018-06-20 LAB — POCT URINALYSIS DIPSTICK
Bilirubin, UA: NEGATIVE
Glucose, UA: NEGATIVE
Ketones, UA: NEGATIVE
Nitrite, UA: NEGATIVE
Protein, UA: NEGATIVE
Spec Grav, UA: 1.02 (ref 1.010–1.025)
UROBILINOGEN UA: 1 U/dL
pH, UA: 6 (ref 5.0–8.0)

## 2018-06-20 MED ORDER — FLUOXETINE HCL 20 MG PO CAPS: 20.0000 mg | ORAL_CAPSULE | Freq: Every day | ORAL | 3 refills | Status: DC

## 2018-06-20 MED FILL — FLUoxetine HCL 20 MG CAPS: 20 | 30 days supply | Qty: 30 | Fill #0

## 2018-06-20 NOTE — Patient Instructions (Signed)
I will call you with the results of your urinalysis.

## 2018-06-20 NOTE — Progress Notes (Signed)
CC: Depression  HPI:  Michelle Nielsen is a 58 y.o. female with hyperlipidemia and tobacco use disorder who presents for evaluation of depression.  Depression: She states that she has felt depressed over the last several months but this has worsened since her father died.  She reports not enjoying activities that she once enjoyed doing.  She has also had increase in sleeping and states that she can sleep more than 12 hours/day.  She denies SI/HI. She has never tried an antidepressant in the past but is interested in starting it today.  Dental caries: She would like to be referred to a dentist for cleaning and routine examination.  She does appear to have multiple dental caries but denies any dental pain.   Dark urine: She reports darker urine over the last several weeks.  She also states that she has had a lot of anxiety which has been correlated with having urinary tract infections in the past.  She denies dysuria, hematuria, or increased frequency.  History of multiple adenomatous colonic polyps: Colonoscopy performed in November 2019 which showed multiple adenomatous colonic polyps which were removed and day large flat laterally spreading transverse colon adenoma.  Colon adenoma removed during repeat colonoscopy on January 2020. She also had an anal polyp found to have anal intraepithelial neoplasia 1 and has an appointment at [facility] for surgery.   Past Medical History:  Diagnosis Date  . Abnormal mammogram of right breast    having biopsy 10/29/15  . Hearing loss   . Vaginal Pap smear, abnormal    Review of Systems:   Review of Systems  Constitutional: Negative for chills and fever.  Respiratory: Negative for cough and sputum production.   Cardiovascular: Negative for chest pain and palpitations.  Gastrointestinal: Negative for abdominal pain, constipation, diarrhea, nausea and vomiting.  Genitourinary: Negative for dysuria, flank pain and hematuria.  Neurological: Negative for  dizziness and headaches.  Psychiatric/Behavioral: Positive for depression. Negative for memory loss and suicidal ideas. The patient does not have insomnia.   All other systems reviewed and are negative.   Physical Exam:  Vitals:   06/20/18 1430  BP: 133/88  Pulse: (!) 103  Temp: 98.9 F (37.2 C)  TempSrc: Oral  SpO2: 99%  Weight: 122 lb 14.4 oz (55.7 kg)  Height: 5\' 7"  (1.702 m)   Physical Exam Vitals signs reviewed.  Constitutional:      Appearance: Normal appearance.  HENT:     Head: Normocephalic and atraumatic.     Mouth/Throat:     Comments: Multiple dental caries of the upper and lower teeth. Cardiovascular:     Rate and Rhythm: Normal rate and regular rhythm.     Pulses: Normal pulses.     Heart sounds: Normal heart sounds.  Pulmonary:     Effort: Pulmonary effort is normal. No respiratory distress.     Breath sounds: Normal breath sounds.  Musculoskeletal:        General: No tenderness.     Right lower leg: No edema.     Left lower leg: No edema.  Skin:    General: Skin is warm and dry.  Neurological:     Mental Status: She is alert and oriented to person, place, and time. Mental status is at baseline.  Psychiatric:        Mood and Affect: Mood normal.        Behavior: Behavior normal.     Assessment & Plan:   See Encounters Tab for problem based  charting.  Patient discussed with Dr. Evette Doffing

## 2018-06-21 ENCOUNTER — Telehealth: Payer: Self-pay | Admitting: Licensed Clinical Social Worker

## 2018-06-21 DIAGNOSIS — R82998 Other abnormal findings in urine: Secondary | ICD-10-CM | POA: Insufficient documentation

## 2018-06-21 DIAGNOSIS — F329 Major depressive disorder, single episode, unspecified: Secondary | ICD-10-CM | POA: Insufficient documentation

## 2018-06-21 DIAGNOSIS — K029 Dental caries, unspecified: Secondary | ICD-10-CM | POA: Insufficient documentation

## 2018-06-21 DIAGNOSIS — F32A Depression, unspecified: Secondary | ICD-10-CM | POA: Insufficient documentation

## 2018-06-21 LAB — URINALYSIS, COMPLETE
Bilirubin, UA: NEGATIVE
Glucose, UA: NEGATIVE
Ketones, UA: NEGATIVE
NITRITE UA: NEGATIVE
Protein, UA: NEGATIVE
Specific Gravity, UA: 1.024 (ref 1.005–1.030)
UUROB: 1 mg/dL (ref 0.2–1.0)
pH, UA: 5.5 (ref 5.0–7.5)

## 2018-06-21 LAB — MICROSCOPIC EXAMINATION: Casts: NONE SEEN /lpf

## 2018-06-21 NOTE — Assessment & Plan Note (Signed)
Assessment/plan: She reports multiple symptoms of depression and PHQ 9 elevated today to 11.  After a long discussion including risks and benefits Michelle Nielsen would like to start an antidepressant.  I have prescribed fluoxetine mg daily.  I have also sent in a referral for behavioral health.

## 2018-06-21 NOTE — Telephone Encounter (Signed)
Patient was contacted to schedule an appointment with [facility] given a referral from her PCP. Patient reported that she was interested in receiving services and an initial appointment was scheduled for 06/28/18 at 1:30 PM.   -Jackalyn Lombard, Behavioral Health Intern

## 2018-06-21 NOTE — Assessment & Plan Note (Signed)
Assessment/plan: She has multiple dental caries on exam no signs of an abscess or infection.  I will refer her to a dentist today.

## 2018-06-21 NOTE — Assessment & Plan Note (Signed)
Assessment/plan: Michelle Nielsen presents with dark urine and anxiety that she believes can be related to UTI.  Urinalysis was unremarkable the exception of 6-10 WBC/hpf and 0-2 RBC/hpf.  I do not believe that her symptoms are due to an infection or microscopic hematuria microscopic (which is defined as 3 RBCs or more per high-power field).  We will continue to monitor for now.

## 2018-06-22 NOTE — Progress Notes (Signed)
Internal Medicine Clinic Attending  Case discussed with Dr. Prince at the time of the visit.  We reviewed the resident's history and exam and pertinent patient test results.  I agree with the assessment, diagnosis, and plan of care documented in the resident's note.   

## 2018-06-28 ENCOUNTER — Encounter: Payer: Self-pay | Admitting: Internal Medicine

## 2018-06-28 ENCOUNTER — Ambulatory Visit: Payer: No Typology Code available for payment source | Admitting: Licensed Clinical Social Worker

## 2018-06-28 NOTE — Addendum Note (Signed)
Addended by: Hulan Fray on: 06/28/2018 07:24 PM   Modules accepted: Orders

## 2018-07-13 ENCOUNTER — Other Ambulatory Visit: Payer: Self-pay

## 2018-07-13 ENCOUNTER — Telehealth: Payer: Self-pay | Admitting: Internal Medicine

## 2018-07-13 DIAGNOSIS — K620 Anal polyp: Secondary | ICD-10-CM

## 2018-07-13 NOTE — Telephone Encounter (Signed)
Yes, we can repeat the surveillance colonoscopy to examine the post polypectomy site from January 2020 where a flat adenoma was removed in piecemeal fashion Okay to schedule on 08/29/2018 if spot available and patient amenable otherwise we can wait on my May 2020 outpatient hospital schedule

## 2018-07-13 NOTE — Telephone Encounter (Signed)
Pt scheduled for previsit 07/14/18@2pm . Colon scheduled at [facility] 08/29/18@9am . Pt aware of appts.

## 2018-07-13 NOTE — Telephone Encounter (Signed)
Reviewed from [facility] where she was sent for anal intraepithelial neoplasia. She had repeat examination and biopsy showing low-grade intraepithelial neoplasia (AIN-1) It was their recommendation based on these findings for: "If LSIL, would monitor with annual anal cytology either with her PCP or Gynecologist"  Thus she will need to have this monitored annually with anal cytology with her primary care provider or gynecologist.  This will need to be performed in March 2021.  If her PCP or gynecology cannot perform anal cytology then she needs to return to [facility] where she saw Dr. Carlos American

## 2018-07-13 NOTE — Telephone Encounter (Signed)
See below and advise

## 2018-07-13 NOTE — Telephone Encounter (Signed)
Pt aware. Pt wants to know if she can have the repeat colon done prior to 6/18 as her 100% assistance with [facility] will end that date. Please advise.

## 2018-07-14 ENCOUNTER — Other Ambulatory Visit: Payer: Self-pay

## 2018-07-14 ENCOUNTER — Ambulatory Visit (AMBULATORY_SURGERY_CENTER): Payer: Self-pay

## 2018-07-14 VITALS — Ht 67.0 in | Wt 123.6 lb

## 2018-07-14 DIAGNOSIS — Z86010 Personal history of colonic polyps: Secondary | ICD-10-CM

## 2018-07-14 MED ORDER — NA SULFATE-K SULFATE-MG SULF 17.5-3.13-1.6 GM/177ML PO SOLN: 1.0000 | Freq: Once | ORAL | 0 refills | Status: AC

## 2018-07-14 NOTE — Progress Notes (Signed)
Denies allergies to eggs or soy products. Denies complication of anesthesia or sedation. Denies use of weight loss medication. Denies use of O2.   Emmi instructions declined.    A sample of Suprep was given to the patient.  

## 2018-07-21 ENCOUNTER — Telehealth: Payer: Self-pay | Admitting: *Deleted

## 2018-07-21 NOTE — Telephone Encounter (Signed)
I recommend this procedure be kept in place given the high risk nature of the lesion removed, this is not usual/normal surveillance given that it is a short interval after piecemeal EMR

## 2018-07-21 NOTE — Telephone Encounter (Signed)
I have spoken with Kim at [facility] at hospital to ask that patient be kept on schedule as an essential procedure per Dr Pyrtle.  She has placed a note to indicate this. 

## 2018-07-21 NOTE — Telephone Encounter (Signed)
Patient is currently scheduled for hospital colonoscopy at [facility] endoscopy due to hx flat adenoma removed piecemeal fashion 05/2018. Patient previously requested to have procedure completed at this time due to [facility] 100% patient assistance being over in June. See 07/13/2018 telephone note... Patient also has recent hx of anal intraepithelial neoplasia.  Hospital requests that all non-essential procedures be cancelled at this time due to COVID-19 pandemic. Dr Hilarie Fredrickson, please advise.... keep procedure as scheduled or cancel for time being?

## 2018-07-27 ENCOUNTER — Telehealth: Payer: Self-pay | Admitting: Licensed Clinical Social Worker

## 2018-07-27 ENCOUNTER — Encounter: Payer: Self-pay | Admitting: Licensed Clinical Social Worker

## 2018-07-27 NOTE — Telephone Encounter (Signed)
A voicemail was left for the patient to contact our office if she would like to reschedule her missed appointment. A letter will be mailed as well.

## 2018-08-18 ENCOUNTER — Telehealth: Payer: Self-pay | Admitting: *Deleted

## 2018-08-18 NOTE — Telephone Encounter (Signed)
Attempted to call pt, Michelle Nielsen for rtc see sharonp.

## 2018-08-18 NOTE — Telephone Encounter (Signed)
Pt is uninsured and had ENT referral submitted in October and November through the [facility] program. Will ask Silverio Decamp to call [facility] scheduler in AM to see if we can get STAT ENT referral. In the meanwhile, Dr. Beryle Beams has given permission to bring pt into [facility] to be seen Friday and will pursue ENT referral as soon as possible Friday AM. VO Dr. Johnsie Kindred, RN, 08/18/18 4:35P

## 2018-08-18 NOTE — Telephone Encounter (Signed)
Pt calls and states she has had appr 7 surgeries on her ears, last few dr Charleston Ropes. Pt states she was to have a referral in nov 2019 to ENT and has not gotten one yet. She is having drainage from both ears, clear with BAD odor.  Wants to know about referral and can you call something in for the drainage Sending to dr's bloomfield, attending and sharon powers for check on referral

## 2018-08-18 NOTE — Telephone Encounter (Signed)
Sounds like a more complex ENT problem. I would be reluctant to call anything in. I would have her call an ENT MD office here in town for advice or go to ED. Give her numbers for [facility], Dr Benjamine Mola, & Dr Radene Journey. Thanks DrG

## 2018-08-19 ENCOUNTER — Other Ambulatory Visit: Payer: Self-pay

## 2018-08-19 ENCOUNTER — Ambulatory Visit (INDEPENDENT_AMBULATORY_CARE_PROVIDER_SITE_OTHER): Payer: Self-pay | Admitting: Internal Medicine

## 2018-08-19 ENCOUNTER — Telehealth: Payer: Self-pay | Admitting: Internal Medicine

## 2018-08-19 VITALS — BP 141/90 | HR 88 | Temp 98.5°F | Ht 67.0 in | Wt 127.4 lb

## 2018-08-19 DIAGNOSIS — Z9889 Other specified postprocedural states: Secondary | ICD-10-CM

## 2018-08-19 DIAGNOSIS — Z8669 Personal history of other diseases of the nervous system and sense organs: Secondary | ICD-10-CM

## 2018-08-19 DIAGNOSIS — H6592 Unspecified nonsuppurative otitis media, left ear: Secondary | ICD-10-CM | POA: Insufficient documentation

## 2018-08-19 DIAGNOSIS — H6593 Unspecified nonsuppurative otitis media, bilateral: Secondary | ICD-10-CM

## 2018-08-19 MED ORDER — FLUTICASONE PROPIONATE 50 MCG/ACT NA SUSP: 1.0000 | Freq: Every day | NASAL | 0 refills | Status: AC

## 2018-08-19 NOTE — Addendum Note (Signed)
Addended byIna Homes T on: 08/19/2018 03:07 PM   Modules accepted: Orders

## 2018-08-19 NOTE — Progress Notes (Signed)
   CC: Ear drainage  HPI:  Michelle Nielsen is a 58 y.o. female with PMHx listed below presenting for ear discharge. Please see the A&P for the status of the patient's chronic medical problems.  Past Medical History:  Diagnosis Date  . Abnormal mammogram of right breast    having biopsy 10/29/15  . Hearing loss   . Vaginal Pap smear, abnormal    Review of Systems:  Performed and all others negative.  Physical Exam: Vitals:   08/19/18 1336  BP: (!) 141/90  Pulse: 88  Temp: 98.5 F (36.9 C)  TempSrc: Oral  SpO2: 100%  Weight: 127 lb 6.4 oz (57.8 kg)  Height: 5\' 7"  (1.702 m)   General: Well nourished female in no acute distress HENT: Normocephalic, atraumatic, moist mucus membranes, external ear canal is clear without cerumen, erythema, or purulents bilaterally. Effusion noted behind the left TM  Assessment & Plan:   See Encounters Tab for problem based charting.  Patient discussed with Dr. Beryle Beams

## 2018-08-19 NOTE — Patient Instructions (Signed)
Thank you for allowing [place] to provide your care. I have sent out a nasal spray that should help to relive the fluid behind your ear drum. If you start to have pain or worsening discharge please call back and we will get some ear drops sent out.

## 2018-08-19 NOTE — Telephone Encounter (Signed)
  NURSING TRIAGE NOTE FOR RESPIRATORY SYMPTOMS  Do you have a fever? No  Do you have a cough? No Do you have shortness of breath more than normal? No Do you have chest pain?No Are you able to eat and drink normally? No Have you seen a physician for these symptoms? No  Action - Pt has an [facility] appt today @ 1415 PM.

## 2018-08-19 NOTE — Telephone Encounter (Signed)
Just called patient at the request of Michelle Nielsen to offer patient an appt for today, but no answer.  Left detailed message asking patient to please call me back.

## 2018-08-19 NOTE — Telephone Encounter (Signed)
Pt is returning the call, pls return call [phone]

## 2018-08-19 NOTE — Assessment & Plan Note (Signed)
HPI:  Patient presented with bilateral ear discharge. She has a history of chronic ear infections and has had multiple ear surgeries. She is trying to get back to ENT for further evaluation but has been unable to do so. Over the past couple days she has noticed increased discharge from her left ear. Denies pain, fevers, nasal congestion, pressure.   PE as listed in the note.   A/P: - Will try nasal corticosteroid to relieve effusion behind left TM  - If patient notices increasing pain or discharge with send out ear drops but at this point, no evidence of otitis externa

## 2018-08-19 NOTE — Progress Notes (Signed)
Medicine attending: Medical history, presenting problems, physical findings, and medications, reviewed with resident physician Dr Ina Homes on the day of the patient visit and I concur with his evaluation and management plan. Chronic ear problems; hx of multiple bilat surgeries; called 4/16 C/O bilateral foul smelling drainage. Does not have local ENT. Per Dr Lemmie Evens: no exudate; fluid behind drum on one side. Rx anti-histamine. ENT referral. Pt w no insurance.

## 2018-08-25 ENCOUNTER — Encounter (HOSPITAL_COMMUNITY): Payer: Self-pay | Admitting: *Deleted

## 2018-08-25 ENCOUNTER — Other Ambulatory Visit: Payer: Self-pay

## 2018-08-25 ENCOUNTER — Telehealth: Payer: Self-pay | Admitting: *Deleted

## 2018-08-25 DIAGNOSIS — K620 Anal polyp: Secondary | ICD-10-CM

## 2018-08-25 NOTE — Telephone Encounter (Signed)
Dr Hilarie Fredrickson has suggested that we contact patient and have her come for colonoscopy at [facility] since we are unable to have her procedure completed at the hospital at this time (due to COVID-19 restrictions). We have availability at [facility] and her case does need fairly urgent follow up. Her [facility] assistance also runs out in June. Dr Hilarie Fredrickson thinks that he may be able to take care of everything in [facility] as far as lifting her polyp. Patient verbalizes understanding and is also advised that there may be a small chance that we need to go back in at some point if there happens to be any part of the polyp that is not able to be removed.  Case rescheduled to 08/30/18 at 830 am [facility]. Verbally went over instructions again (she already has written instructions) and reminded that she needs care partner 6 or older to bring her, stay for procedure and take her home. She verbalizes understanding of this as well.

## 2018-08-29 ENCOUNTER — Telehealth: Payer: Self-pay | Admitting: *Deleted

## 2018-08-29 ENCOUNTER — Ambulatory Visit (HOSPITAL_COMMUNITY)
Admission: RE | Admit: 2018-08-29 | Payer: No Typology Code available for payment source | Source: Home / Self Care | Admitting: Internal Medicine

## 2018-08-29 SURGERY — COLONOSCOPY WITH PROPOFOL
Anesthesia: Monitor Anesthesia Care

## 2018-08-29 NOTE — Telephone Encounter (Signed)
Left detailed message about care partner waiting in the car during procedure.  COVID 19 screening questions asked and pt instructed to call back if she has traveled, had a fever or respiratory problems or family members diagnosed with COVID 19 in the past two weeks

## 2018-08-29 NOTE — Telephone Encounter (Signed)
LMOM- need to change appointment time to 10:30 a.m. with a 9:30 a.m. arrival time and to go over COVID screening questions

## 2018-08-30 ENCOUNTER — Encounter: Payer: Self-pay | Admitting: Internal Medicine

## 2018-08-30 ENCOUNTER — Ambulatory Visit (AMBULATORY_SURGERY_CENTER): Payer: Self-pay | Admitting: Internal Medicine

## 2018-08-30 ENCOUNTER — Other Ambulatory Visit: Payer: Self-pay

## 2018-08-30 VITALS — BP 106/78 | HR 72 | Temp 98.3°F | Resp 16 | Ht 67.0 in | Wt 123.0 lb

## 2018-08-30 DIAGNOSIS — D122 Benign neoplasm of ascending colon: Secondary | ICD-10-CM

## 2018-08-30 DIAGNOSIS — K6289 Other specified diseases of anus and rectum: Secondary | ICD-10-CM

## 2018-08-30 DIAGNOSIS — Z860100 Personal history of colon polyps, unspecified: Secondary | ICD-10-CM

## 2018-08-30 DIAGNOSIS — D128 Benign neoplasm of rectum: Secondary | ICD-10-CM

## 2018-08-30 DIAGNOSIS — K6282 Dysplasia of anus: Secondary | ICD-10-CM

## 2018-08-30 DIAGNOSIS — D123 Benign neoplasm of transverse colon: Secondary | ICD-10-CM

## 2018-08-30 DIAGNOSIS — D129 Benign neoplasm of anus and anal canal: Secondary | ICD-10-CM

## 2018-08-30 DIAGNOSIS — K635 Polyp of colon: Secondary | ICD-10-CM

## 2018-08-30 MED ORDER — SODIUM CHLORIDE 0.9 % IV SOLN: 500.0000 mL | Freq: Once | INTRAVENOUS | Status: DC

## 2018-08-30 NOTE — Progress Notes (Signed)
To PACU, VSS. Report to [email or web address].tb 

## 2018-08-30 NOTE — Progress Notes (Signed)
Called to room to assist during endoscopic procedure.  Patient ID and intended procedure confirmed with present staff. Received instructions for my participation in the procedure from the performing physician.  

## 2018-08-30 NOTE — Op Note (Signed)
Michelle Nielsen: Michelle Nielsen Procedure Date: 08/30/2018 10:56 AM MRN: [id number] Endoscopist: Jerene Bears , MD Age: 58 Referring MD:  Date of Birth: Feb 18, 1961 Gender: Female Account #: [id number] Procedure:                Colonoscopy Indications:              Surveillance: Personal history of piecemeal removal                            of large sessile adenoma on last colonoscopy (less                            than 6 months ago) - EMR Jan 2020 of large                            transverse adenoma; history of AIN treated at [facility]                            with ablation Medicines:                Monitored Anesthesia Care Procedure:                Pre-Anesthesia Assessment:                           - Prior to the procedure, a History and Physical                            was performed, and patient medications and                            allergies were reviewed. The patient's tolerance of                            previous anesthesia was also reviewed. The risks                            and benefits of the procedure and the sedation                            options and risks were discussed with the patient.                            All questions were answered, and informed consent                            was obtained. Prior Anticoagulants: The patient has                            taken no previous anticoagulant or antiplatelet                            agents. ASA Grade Assessment: II - A patient with  mild systemic disease. After reviewing the risks                            and benefits, the patient was deemed in                            satisfactory condition to undergo the procedure.                           After obtaining informed consent, the colonoscope                            was passed under direct vision. Throughout the                            procedure, the patient's blood pressure, pulse, and                          oxygen saturations were monitored continuously. The                            Model PCF-[id number]) scope was introduced                            through the anus and advanced to the cecum,                            identified by appendiceal orifice and ileocecal                            valve. The colonoscopy was performed without                            difficulty. The patient tolerated the procedure                            well. The quality of the bowel preparation was                            good. The ileocecal valve, appendiceal orifice, and                            rectum were photographed. Scope In: 11:07:41 AM Scope Out: 11:33:29 AM Scope Withdrawal Time: 0 hours 22 minutes 3 seconds  Total Procedure Duration: 0 hours 25 minutes 48 seconds  Findings:                 The digital rectal exam was normal.                           Two sessile polyps were found in the ascending                            colon. The polyps were 1 to 6 mm in size. These  polyps were removed with a cold snare. Resection                            and retrieval were complete.                           A post polypectomy scar was found in the transverse                            colon with a clearly visible mucosal tattoo distal                            to the scar. There was residual polypoid tissue.                            The polyp was removed with a cold snare and cold                            biopsy forceps (Jar 2). Resection and retrieval                            were complete. Additional cold biopsies were                            obtained in the area of scarring to ensure no                            residual adenoma (Jar 3).                           A few small-mouthed diverticula were found in the                            sigmoid colon, descending colon and transverse                            colon.                            A 4 mm area of polypoid tissue was found in the                            anal canal. The polyp was sessile. This was                            biopsied with a cold forceps for histology to                            exclude residual dysplasia (given history of AIN                            with recent treatment).  No additional abnormalities were found on                            retroflexion. Complications:            No immediate complications. Estimated Blood Loss:     Estimated blood loss was minimal. Impression:               - Two 1 to 6 mm polyps in the ascending colon,                            removed with a cold snare. Resected and retrieved.                           - Post-polypectomy scar in the transverse colon.                            Residual polypoid tissue at scar removed with cold                            snare and biopsy.                           - Additional cold biopsies around polypectomy scar                            to exclude residual adenomatous change.                           - Diverticulosis in the sigmoid colon, in the                            descending colon and in the transverse colon.                           - One 4 mm polyp at the anus. Biopsied. Recommendation:           - Patient has a contact number available for                            emergencies. The signs and symptoms of potential                            delayed complications were discussed with the                            patient. Return to normal activities tomorrow.                            Written discharge instructions were provided to the                            patient.                           - Resume previous diet.                           -  Continue present medications.                           - Await pathology results.                           - Repeat colonoscopy is recommended for                             surveillance. The colonoscopy date will be                            determined after pathology results from today's                            exam become available for review. Jerene Bears, MD 08/30/2018 11:43:23 AM This report has been signed electronically.

## 2018-08-30 NOTE — Patient Instructions (Signed)
Information on polyps given to you today.  Await pathology results.  YOU HAD AN ENDOSCOPIC PROCEDURE TODAY AT THE [facility]:   Refer to the procedure report that was given to you for any specific questions about what was found during the examination.  If the procedure report does not answer your questions, please call your gastroenterologist to clarify.  If you requested that your care partner not be given the details of your procedure findings, then the procedure report has been included in a sealed envelope for you to review at your convenience later.  YOU SHOULD EXPECT: Some feelings of bloating in the abdomen. Passage of more gas than usual.  Walking can help get rid of the air that was put into your GI tract during the procedure and reduce the bloating. If you had a lower endoscopy (such as a colonoscopy or flexible sigmoidoscopy) you may notice spotting of blood in your stool or on the toilet paper. If you underwent a bowel prep for your procedure, you may not have a normal bowel movement for a few days.  Please Note:  You might notice some irritation and congestion in your nose or some drainage.  This is from the oxygen used during your procedure.  There is no need for concern and it should clear up in a day or so.  SYMPTOMS TO REPORT IMMEDIATELY:   Following lower endoscopy (colonoscopy or flexible sigmoidoscopy):  Excessive amounts of blood in the stool  Significant tenderness or worsening of abdominal pains  Swelling of the abdomen that is new, acute  Fever of 100F or higher   For urgent or emergent issues, a gastroenterologist can be reached at any hour by calling [phone].   DIET:  We do recommend a small meal at first, but then you may proceed to your regular diet.  Drink plenty of fluids but you should avoid alcoholic beverages for 24 hours.  ACTIVITY:  You should plan to take it easy for the rest of today and you should NOT DRIVE or use heavy machinery  until tomorrow (because of the sedation medicines used during the test).    FOLLOW UP: Our staff will call the number listed on your records the next business day following your procedure to check on you and address any questions or concerns that you may have regarding the information given to you following your procedure. If we do not reach you, we will leave a message.  However, if you are feeling well and you are not experiencing any problems, there is no need to return our call.  We will assume that you have returned to your regular daily activities without incident.  If any biopsies were taken you will be contacted by phone or by letter within the next 1-3 weeks.  Please call us at [phone] if you have not heard about the biopsies in 3 weeks.    SIGNATURES/CONFIDENTIALITY: You and/or your care partner have signed paperwork which will be entered into your electronic medical record.  These signatures attest to the fact that that the information above on your After Visit Summary has been reviewed and is understood.  Full responsibility of the confidentiality of this discharge information lies with you and/or your care-partner. 

## 2018-08-31 ENCOUNTER — Telehealth: Payer: Self-pay | Admitting: *Deleted

## 2018-08-31 NOTE — Telephone Encounter (Signed)
  Follow up Call-  Call back number 08/30/2018 03/14/2018 03/14/2018  Post procedure Call Back phone  # [phone] -  Permission to leave phone message Yes No Yes  comments - phone not set up -  Some recent data might be hidden   [facility]

## 2018-09-12 ENCOUNTER — Telehealth: Payer: Self-pay

## 2018-09-12 NOTE — Telephone Encounter (Signed)
Left message at 2:45 on 09/12/2018 for patient to call back with any fever, cough or respiratory symptoms. Also told patient that I would be trying to contact her later.

## 2018-09-12 NOTE — Telephone Encounter (Signed)
1. Have you developed a fever since your procedure? No.  2.   Have you had an respiratory symptoms (SOB or cough) since your procedure? No.  3.   Have you tested positive for COVID 19 since your procedure No.  3.   Have you had any family members/close contacts diagnosed with the COVID 19 since your procedure?  No   If any of these questions are a yes, please inquire if patient has been seen by family doctor and route this note to Tracy Walton, RN. 

## 2018-09-12 NOTE — Telephone Encounter (Signed)
Pt returned your call, she answered "no" to all questions.

## 2018-09-12 NOTE — Telephone Encounter (Signed)
Left message at 3:32 on 09/12/2018 explaining to call us if she has any Covid symptoms including fever, respiratory or shortness of breath. Also if she or any close family member or friend has been diagnosed with Covid to please call us immediately.

## 2018-11-01 ENCOUNTER — Other Ambulatory Visit: Payer: Self-pay

## 2018-11-01 ENCOUNTER — Ambulatory Visit (HOSPITAL_COMMUNITY)
Admission: RE | Admit: 2018-11-01 | Discharge: 2018-11-01 | Disposition: A | Discharge status: Home / Self Care | Payer: Medicaid Other | Source: Ambulatory Visit | Attending: Obstetrics and Gynecology | Admitting: Obstetrics and Gynecology

## 2018-11-01 ENCOUNTER — Encounter (HOSPITAL_COMMUNITY): Payer: Self-pay

## 2018-11-01 DIAGNOSIS — N898 Other specified noninflammatory disorders of vagina: Secondary | ICD-10-CM

## 2018-11-01 DIAGNOSIS — Z01419 Encounter for gynecological examination (general) (routine) without abnormal findings: Secondary | ICD-10-CM

## 2018-11-01 NOTE — Addendum Note (Signed)
Encounter addended by: Loletta Parish, RN on: 11/01/2018 12:11 PM  Actions taken: Visit diagnoses modified, Problem List modified

## 2018-11-01 NOTE — Progress Notes (Addendum)
No complaints today.   Pap Smear: Pap smear completed today. Last Pap smear was6/25/2019 in [facility] and normal with negative HPV.Patient had a colposcopy completed 01/27/2017 that was inadequate and a 44-month follow-up Pap smear recommended. Patient has a history of an abnormal Pap smear 12/31/2016 at [facility] and LGSIL with positive HPV and 08/13/2015 at [facility] and ASCUS with positive HPV.Patient had a colposcopy to follow up fortheabnormal Pap smear on 01/27/2017 that was benign and 10/14/2015 that showed squamous dysplasia and a LEEP 10/25/2015 that showed CIN II. Last threePap smears,twocolposcopies, and LEEP results are in EPIC.    Pelvic/Bimanual   Ext Genitalia No lesions, no swelling and no discharge observed on external genitalia.         Vagina Vagina pink and normal texture. No lesions and a frothy yellowish colored discharge observed in vagina. Wet prep completed.          Cervix Cervix is present. Cervix pink and of normal texture. No discharge observed.     Uterus Uterus is present and palpable. Uterus in normal position and normal size.        Adnexae Bilateral ovaries present and palpable. No tenderness on palpation.         Rectovaginal No rectal exam completed today since patient had no rectal complaints. No skin abnormalities observed on exam.    Smoking History: Patientis a current smoker. Discussed smoking cessation with patient. Referred to the [facility] Quitline and gave resources to free smoking cessation classes at [facility].  Patient Navigation: Patient education provided. Access to services provided for patient through [facility] program.   Colorectal Cancer Screening: Patient had a colonoscopy completed 03/14/2018. FIT Test completed 08/23/2017 that was positive. No complaints today.   Breast and Cervical Cancer Risk Assessment: Patient has no family history of breast cancer, known genetic mutations, or radiation treatment to  the chest before age 84. Patient has a history of cervical dysplasia. Patient has no history of being immunocompromised or DES exposure in-utero.  Risk Assessment    No risk assessment data for the current encounter   Risk Scores      03/15/2018   Last edited by: [name], LPN   5-year risk: 1.4 %   Lifetime risk: 7.6 %

## 2018-11-01 NOTE — Patient Instructions (Addendum)
Let Lindwood Coke know if today's Pap smear is normal that her next Pap smear will be due in one year due to her history of abnormal Pap smears. Let patient know will follow up with her within the next couple weeks with results of Pap smear and wet prep by phone. Discussed smoking cessation with patient. Referred to the [facility] Quitline and gave resources to free smoking cessation classes at [facility]. Reminded patient that her next screening mammogram is due in November 2020 and that she will need to renew her BCCCP at that time. Lindwood Coke verbalized understanding.  Pat Elicker, Arvil Chaco, RN 9:32 AM

## 2018-11-01 NOTE — Addendum Note (Signed)
Encounter addended by: Loletta Parish, RN on: 11/01/2018 10:54 AM  Actions taken: Clinical Note Signed

## 2018-11-02 LAB — CYTOLOGY - PAP: Diagnosis: NEGATIVE

## 2018-11-03 ENCOUNTER — Other Ambulatory Visit: Payer: Self-pay | Admitting: Obstetrics and Gynecology

## 2018-11-03 ENCOUNTER — Telehealth (HOSPITAL_COMMUNITY): Payer: Self-pay | Admitting: *Deleted

## 2018-11-03 LAB — CERVICOVAGINAL ANCILLARY ONLY
Bacterial vaginitis: NEGATIVE
Candida vaginitis: POSITIVE — AB
Trichomonas: NEGATIVE

## 2018-11-03 MED ORDER — FLUCONAZOLE 150 MG PO TABS: 150.0000 mg | ORAL_TABLET | Freq: Once | ORAL | 0 refills | Status: AC

## 2018-11-03 MED FILL — FLUCONAZOLE 150 MG TABS: 150 | 1 days supply | Qty: 1 | Fill #0

## 2018-11-03 NOTE — Telephone Encounter (Signed)
Called patient and let her know that her Pap smear was normal and wet prep showed yeast. Let patient know a prescription for Diflucan was sent to her pharmacy. Explained to patient that her next Pap smear is due in one year due to her history of abnormal Pap smears. Patient verbalized understanding.

## 2018-11-09 ENCOUNTER — Ambulatory Visit: Payer: Medicaid Other

## 2018-11-09 ENCOUNTER — Other Ambulatory Visit: Payer: Self-pay

## 2019-01-16 ENCOUNTER — Encounter: Payer: Self-pay | Admitting: Acute Care

## 2019-01-16 ENCOUNTER — Other Ambulatory Visit: Payer: Medicaid Other | Admitting: Acute Care

## 2019-01-16 ENCOUNTER — Other Ambulatory Visit: Payer: Self-pay

## 2019-01-16 DIAGNOSIS — F17210 Nicotine dependence, cigarettes, uncomplicated: Secondary | ICD-10-CM

## 2019-01-16 NOTE — Progress Notes (Signed)
Shared Decision-Making Visit for Lung Cancer Screening [phone] )  Lung Cancer Screening Criteria 1-59-years of age 60+ pack year smoking history No Recent History of coughing up blood   No Unexplained weight loss of > 15 pounds in the last 6 months. No Prior History Lung / other cancer  (Diagnosis within the last 5 years already requiring surveillance chest CT Scans). Pt is a current smoker, or former smoker who has quit within the last 15 years.  This patient meets the criterial noted above and is asymptomatic for any signs or symptoms of lung cancer.  The Shared Decision-Making Visit discussion included risks and benefits of screening, potential for follow up diagnostic testing for abnormal scans, potential for false positive tests, over diagnosis, and discussion about total radiation exposure. Patient stated willingness to undergo diagnostics and treatment as needed. Current smokers were counseled on smoking cessation as the single most powerful action they can take to decrease their risk of lung cancer, pulmonary disease, heart disease and stroke. They were given a resource card with information on receiving free nicotine replacement therapy , and information about free smoking cessation classes.  Pt understands that this scan is being paid for by a grant obtained by the [facility]. We discussed  that there is no guarantee of additional grant money from year to year as these grants are not guaranteed to programs on an annual basis.They have to be applied for and they are awarded based on county need, and utilization of the previous years funds..   She states she does not have any dyspnea. She does not use any inhalers. She denies wheezing. She confirmed she is smoking 1 PPD. She is interested in quitting. I have spent 3  minutes counseling patient on smoking cessation this visit. Patient verbalizes understanding of the risks of  continued smoking and the negative health  consequences including worsening of COPD, risk of lung cancer , stroke and heart disease.. She verbalized understanding.   Smoking cessation card provided with community resources [place] card #6  Magdalen Spatz, AGACNP-BC Elsie Pager # [phone] 01/16/2019 6:44 PM

## 2019-01-20 ENCOUNTER — Other Ambulatory Visit: Payer: Self-pay | Admitting: *Deleted

## 2019-01-20 DIAGNOSIS — F17210 Nicotine dependence, cigarettes, uncomplicated: Secondary | ICD-10-CM

## 2019-01-20 DIAGNOSIS — Z122 Encounter for screening for malignant neoplasm of respiratory organs: Secondary | ICD-10-CM

## 2019-01-24 ENCOUNTER — Other Ambulatory Visit (HOSPITAL_COMMUNITY): Payer: Self-pay | Admitting: *Deleted

## 2019-01-31 ENCOUNTER — Ambulatory Visit
Admission: RE | Admit: 2019-01-31 | Discharge: 2019-01-31 | Disposition: A | Discharge status: Home / Self Care | Payer: Medicaid Other | Source: Ambulatory Visit | Attending: Acute Care | Admitting: Acute Care

## 2019-01-31 DIAGNOSIS — F17210 Nicotine dependence, cigarettes, uncomplicated: Secondary | ICD-10-CM

## 2019-01-31 DIAGNOSIS — Z122 Encounter for screening for malignant neoplasm of respiratory organs: Secondary | ICD-10-CM

## 2019-02-14 ENCOUNTER — Other Ambulatory Visit (HOSPITAL_COMMUNITY): Payer: Self-pay | Admitting: *Deleted

## 2019-02-14 DIAGNOSIS — Z1231 Encounter for screening mammogram for malignant neoplasm of breast: Secondary | ICD-10-CM

## 2019-02-25 ENCOUNTER — Encounter (INDEPENDENT_AMBULATORY_CARE_PROVIDER_SITE_OTHER): Payer: Self-pay

## 2019-02-27 ENCOUNTER — Encounter: Payer: Self-pay | Admitting: Internal Medicine

## 2019-02-27 ENCOUNTER — Other Ambulatory Visit: Payer: Self-pay

## 2019-02-27 ENCOUNTER — Ambulatory Visit (INDEPENDENT_AMBULATORY_CARE_PROVIDER_SITE_OTHER): Payer: Self-pay | Admitting: Internal Medicine

## 2019-02-27 DIAGNOSIS — Z79899 Other long term (current) drug therapy: Secondary | ICD-10-CM

## 2019-02-27 DIAGNOSIS — E782 Mixed hyperlipidemia: Secondary | ICD-10-CM

## 2019-02-27 DIAGNOSIS — R030 Elevated blood-pressure reading, without diagnosis of hypertension: Secondary | ICD-10-CM

## 2019-02-27 DIAGNOSIS — R195 Other fecal abnormalities: Secondary | ICD-10-CM

## 2019-02-27 DIAGNOSIS — F17200 Nicotine dependence, unspecified, uncomplicated: Secondary | ICD-10-CM

## 2019-02-27 DIAGNOSIS — Z720 Tobacco use: Secondary | ICD-10-CM

## 2019-02-27 DIAGNOSIS — Z86010 Personal history of colonic polyps: Secondary | ICD-10-CM

## 2019-02-27 MED ORDER — ATORVASTATIN CALCIUM 40 MG PO TABS: 40.0000 mg | ORAL_TABLET | Freq: Every day | ORAL | 11 refills | Status: DC

## 2019-02-27 MED ORDER — VARENICLINE TARTRATE 1 MG PO TABS: 1.0000 mg | ORAL_TABLET | Freq: Two times a day (BID) | ORAL | 1 refills | Status: AC

## 2019-02-27 MED ORDER — VARENICLINE TARTRATE 0.5 MG X 11 & 1 MG X 42 PO MISC: ORAL | 0 refills | Status: DC

## 2019-02-27 MED ORDER — VARENICLINE TARTRATE 1 MG PO TABS: 1.0000 mg | ORAL_TABLET | Freq: Two times a day (BID) | ORAL | 1 refills | Status: DC

## 2019-02-27 MED FILL — CHANTIX STARTING MONTH BOX: 0.5 MG X 11 | 30 days supply | Qty: 53 | Fill #0

## 2019-02-27 MED FILL — ATORVASTATIN 40 MG TABLET: 40 | 30 days supply | Qty: 30 | Fill #0

## 2019-02-27 NOTE — Patient Instructions (Signed)
Michelle Nielsen, It was great seeing you! I'm glad things are going well. Great job getting all of your cancer screenings done. The last one remaining will be a mammogram. Let me know if you have any difficulties setting this up.   Today we discussed your high cholesterol which we will go ahead and start a medication for (lipitor) that you will take once daily.   I applaud you for wanting to stop smoking! This will greatly reduce your overall health risk in terms of heart disease, cancer, and strokes.  I have sent in the medication, Chantix to help you with this, and we will call to check in about 1 month to see how things are going.   Take care! Dr. Koleen Distance

## 2019-02-27 NOTE — Progress Notes (Signed)
   CC: annual physical   HPI:  [name] is a 58 y.o. female with history of tobacco use disorder who presents for annual check-up and health maintenance screenings. Please see problem based charting for further details.    Past Medical History:  Diagnosis Date  . Abnormal mammogram of right breast    having biopsy 10/29/15  . Hearing loss   . Vaginal Pap smear, abnormal    Review of Systems:  Review of Systems  Constitutional: Negative for chills, fever, malaise/fatigue and weight loss.  HENT: Negative for ear discharge, ear pain, hearing loss and sinus pain.   Eyes: Negative for blurred vision.  Respiratory: Negative for cough, hemoptysis and shortness of breath.   Cardiovascular: Negative for chest pain, palpitations and leg swelling.  Gastrointestinal: Negative for abdominal pain, constipation, diarrhea, nausea and vomiting.  Genitourinary: Negative for dysuria and frequency.  Musculoskeletal: Negative for falls, joint pain and myalgias.  Neurological: Negative for dizziness, sensory change, focal weakness and headaches.    Physical Exam:  Vitals:   02/27/19 1326 02/27/19 1348  BP: (!) 147/98 (!) 143/94  Pulse: (!) 108 (!) 102  Temp: 98.6 F (37 C)   TempSrc: Oral   SpO2: 100%   Weight: 122 lb 12.8 oz (55.7 kg)    General: pleasant, well-nourished, well-appearing female  HEENT: Conjunctiva normal; moist mucous membranes Neck: supple, no thyromegaly  CV: RRR; no m/r/g Pulm: normal work of breathing; lungs CTAB Abd: BS+; abdomen soft, non-tender, non-distended Ext: warm and well perfused; no edema  Neuro: A&Ox3; no focal deficits Psych: pleasant mood and affect    Assessment & Plan:   See Encounters Tab for problem based charting.  Patient discussed with Dr. Lynnae January

## 2019-02-28 ENCOUNTER — Encounter: Payer: Self-pay | Admitting: Internal Medicine

## 2019-02-28 DIAGNOSIS — R030 Elevated blood-pressure reading, without diagnosis of hypertension: Secondary | ICD-10-CM | POA: Insufficient documentation

## 2019-02-28 NOTE — Assessment & Plan Note (Signed)
Patient had colonoscopy in 08/2018 with a few pre-cancerous polys removed. Will return for surveillance in 3 years.

## 2019-02-28 NOTE — Assessment & Plan Note (Addendum)
ASCVD risk score is 29%. She is amenable to starting statin therapy. Given her score, she will need high intensity therapy so will initiate her on Lipitor 40 mg. This in addition to smoking cessation will greatly reduce her risk for heart disease and stroke.

## 2019-02-28 NOTE — Assessment & Plan Note (Signed)
Patient states she gets quite stressed any time she has to go out and drive. She has had a couple of sporadic readings that were elevated upon chart review. She denies any symptoms of elevated blood pressure. She is very resistant to starting any medications so will continue monitoring for now but will need to discuss initiating therapy if she remains elevated at next visit.

## 2019-02-28 NOTE — Assessment & Plan Note (Addendum)
Since having a recent negative CT scan for lung cancer screening in September, patient has been contemplating smoking cessation and is ready to quit. I commended her for this and offered the various treatment options to help support her, including Chantix, Wellbutrin, nicotine replacement, and CBT. Michelle Nielsen has elected to proceed with Chantix which I think is a reasonable option since she has no psychiatric history. Have sent in starter pack and subsequent maintenance dosing. Will arrange for telehealth visit in 1 month flor close follow-up and support.

## 2019-03-01 NOTE — Progress Notes (Signed)
Internal Medicine Clinic Attending  Case discussed with Dr. Bloomfield at the time of the visit.  We reviewed the resident's history and exam and pertinent patient test results.  I agree with the assessment, diagnosis, and plan of care documented in the resident's note.  

## 2019-03-06 ENCOUNTER — Telehealth: Payer: Self-pay | Admitting: Internal Medicine

## 2019-03-06 NOTE — Telephone Encounter (Signed)
Pt is requesting nurse to callback [phone]

## 2019-03-06 NOTE — Telephone Encounter (Signed)
[facility]

## 2019-03-19 IMAGING — MG DIGITAL SCREENING BILATERAL MAMMOGRAM WITH TOMO AND CAD
8 series · 9 of 24 positions shown · non-contrast
Comparison: Previous exam(s).

CLINICAL DATA: Screening.

EXAM:
DIGITAL SCREENING BILATERAL MAMMOGRAM WITH TOMO AND CAD

[R CC synth-2D]
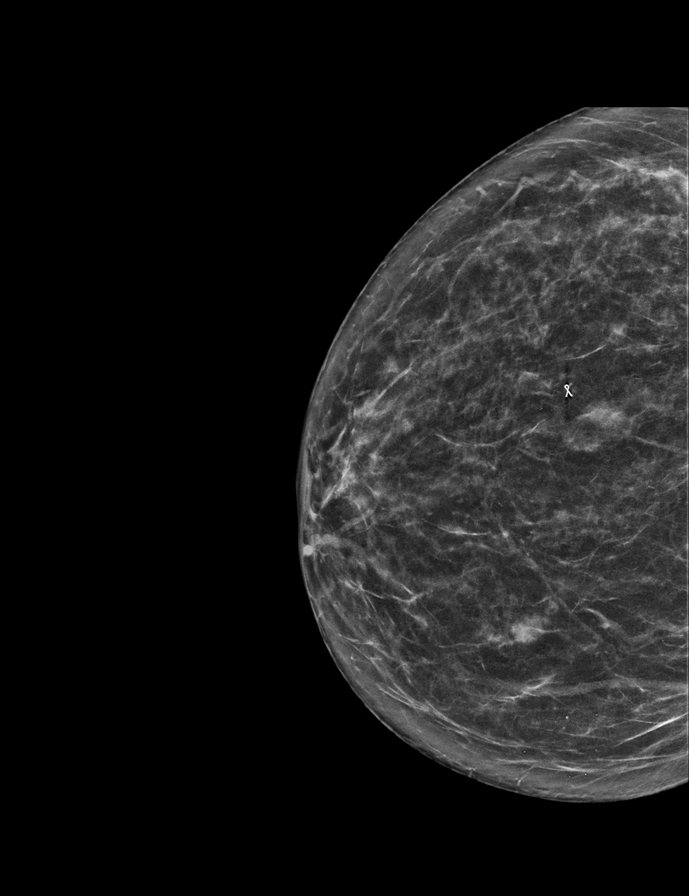

[R MLO synth-2D]
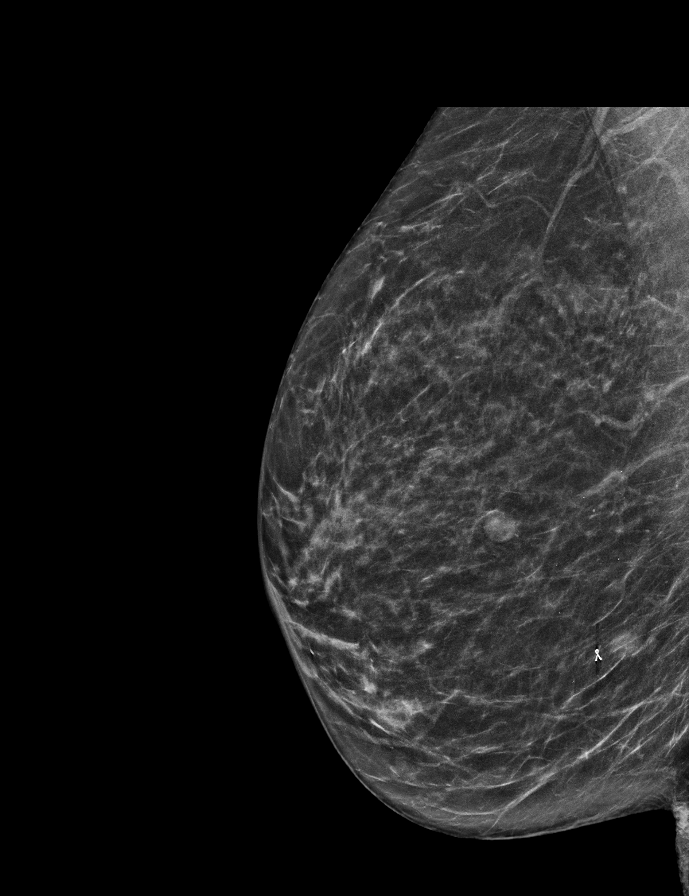

[L CC synth-2D]
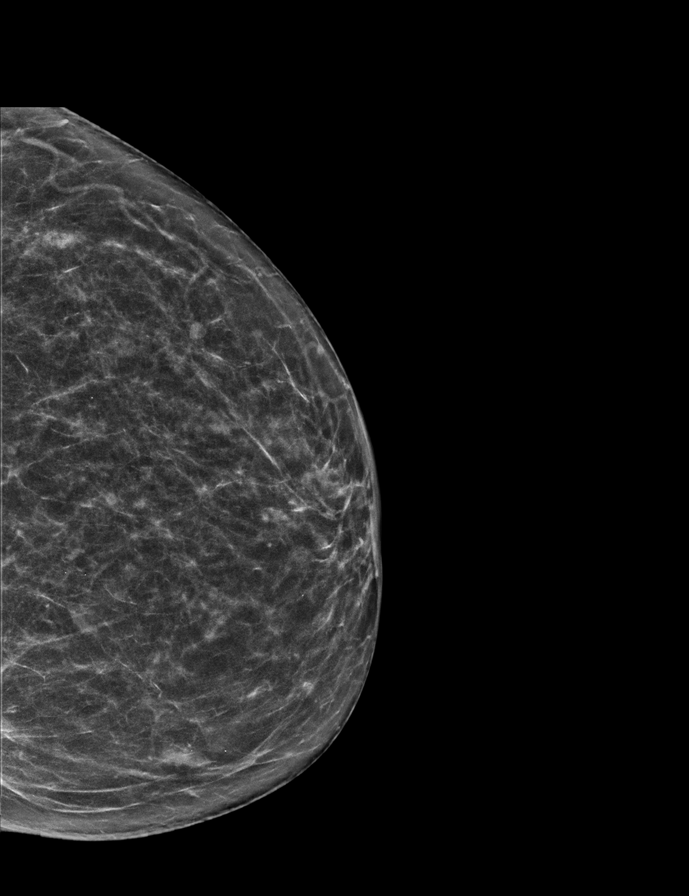

[L MLO synth-2D]
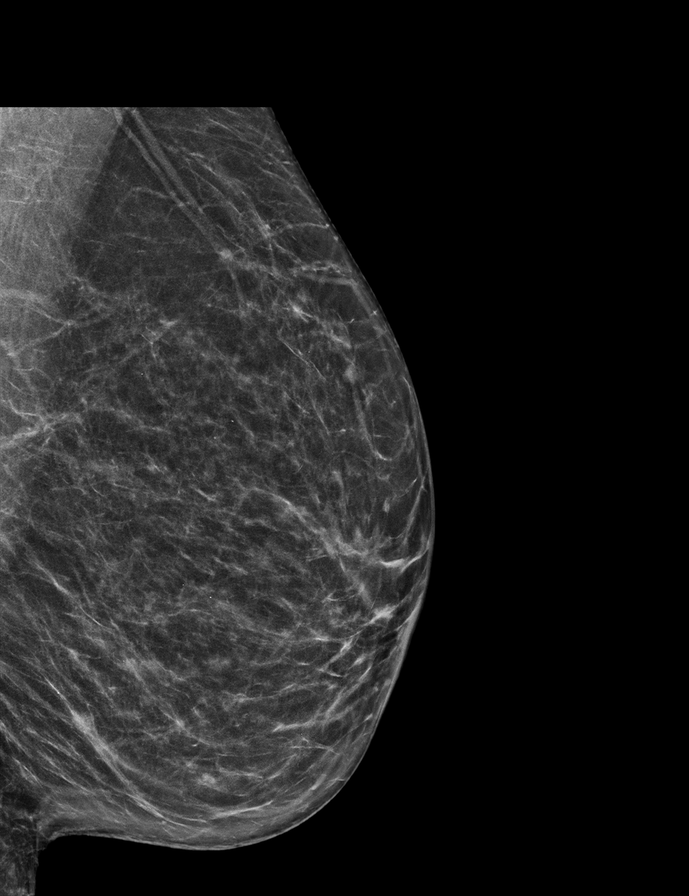

[R MLO tomo · 2 of 58 frames shown]
[frame 19/58]
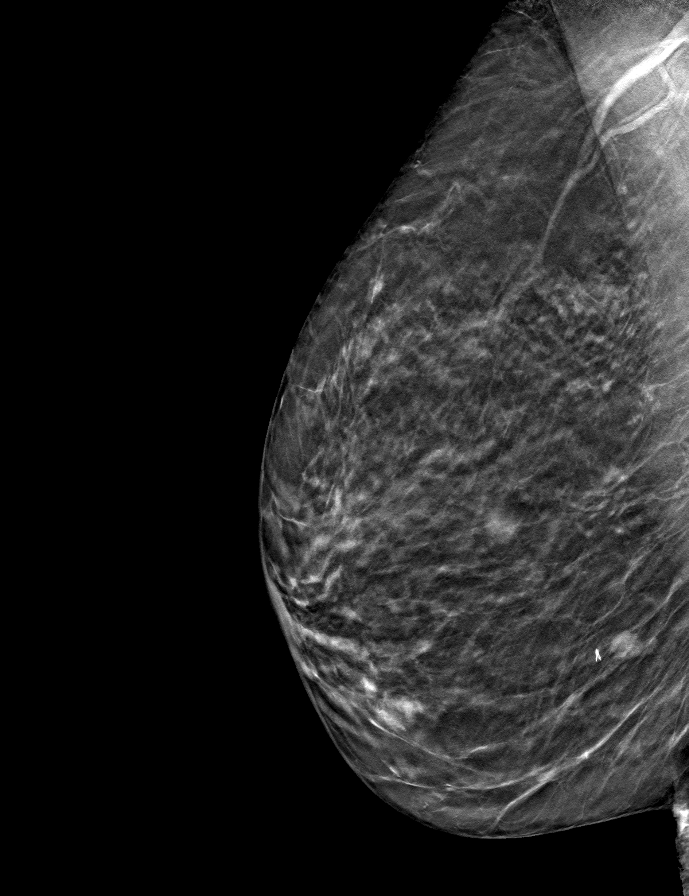
[frame 29/58]
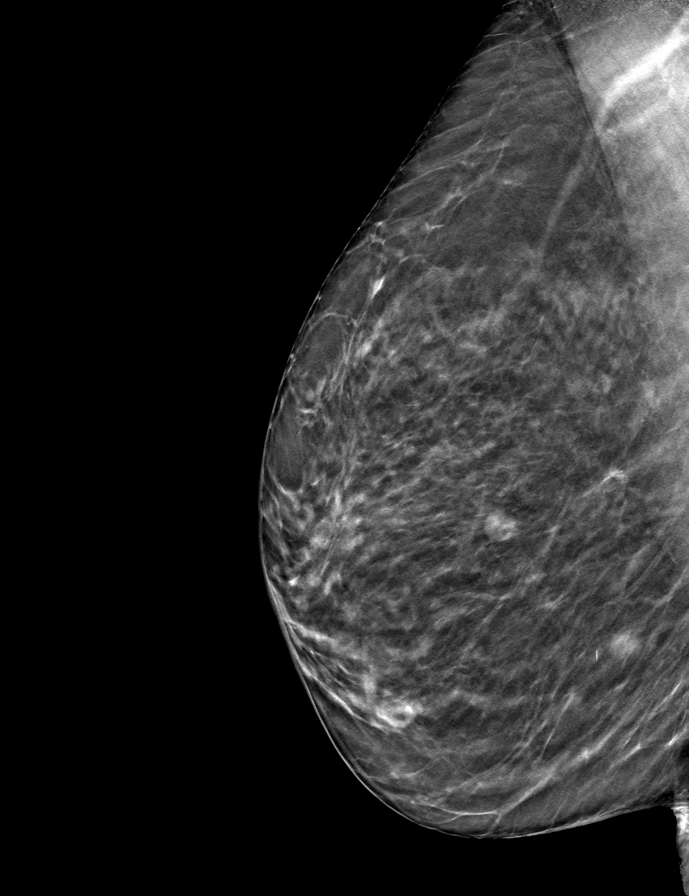

[R CC tomo · tomo slice 30/59.0]
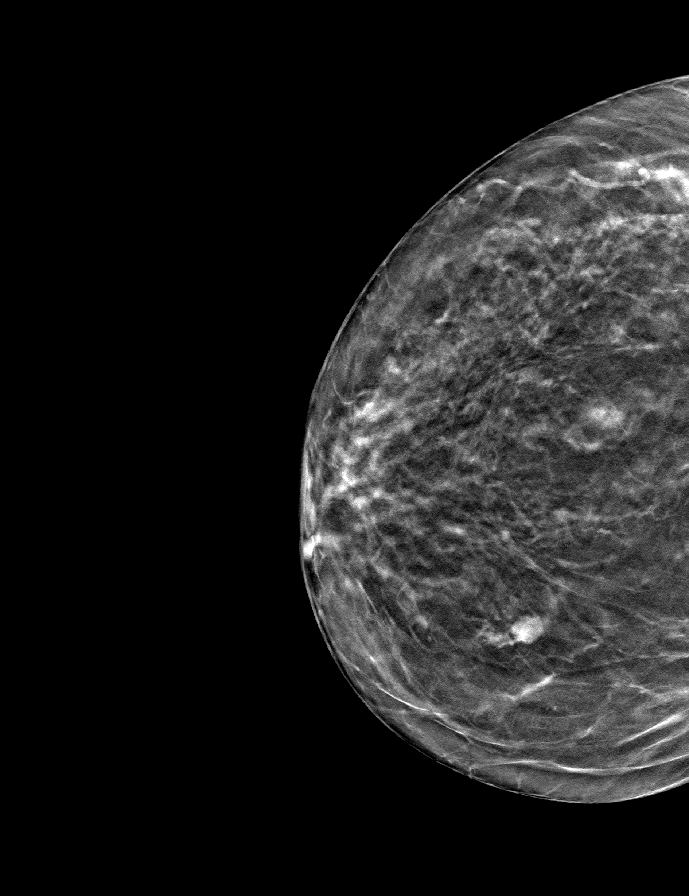

[L MLO tomo · tomo slice 30/59.0]
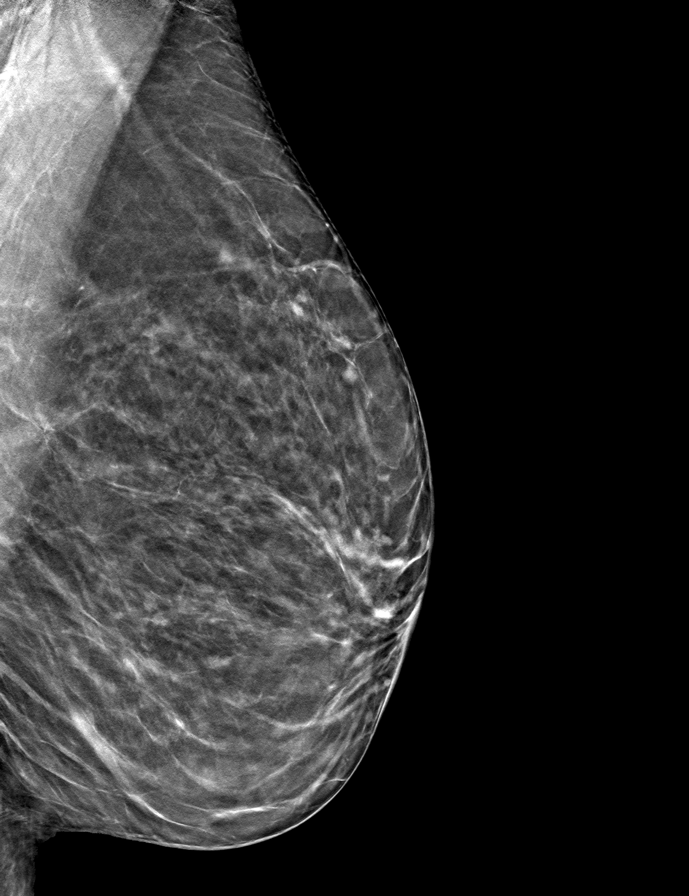

[L CC tomo · tomo slice 29/58.0]
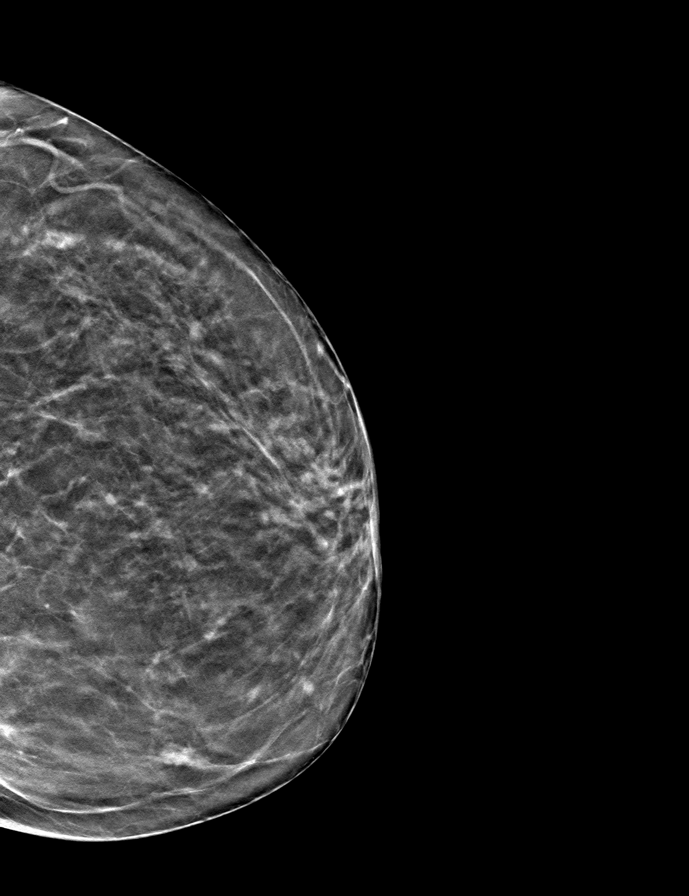

[9 of 24 positions shown; findings below may reference images not displayed]

ACR Breast Density Category b: There are scattered areas of
fibroglandular density.
FINDINGS: In the right breast, a possible mass warrants further evaluation. In
the left breast, no findings suspicious for malignancy. Images were
processed with CAD.
IMPRESSION: Further evaluation is suggested for possible mass in the right
breast.

RECOMMENDATION:
Diagnostic mammogram and possibly ultrasound of the right breast.
(Code:[id number]-550)

The patient will be contacted regarding the findings, and additional
imaging will be scheduled.

BI-RADS CATEGORY  0: Incomplete. Need additional imaging evaluation
and/or prior mammograms for comparison.

## 2019-03-21 ENCOUNTER — Ambulatory Visit (HOSPITAL_COMMUNITY)
Admission: RE | Admit: 2019-03-21 | Discharge: 2019-03-21 | Disposition: A | Discharge status: Home / Self Care | Payer: No Typology Code available for payment source | Source: Ambulatory Visit | Attending: Obstetrics and Gynecology | Admitting: Obstetrics and Gynecology

## 2019-03-21 ENCOUNTER — Other Ambulatory Visit: Payer: Self-pay

## 2019-03-21 ENCOUNTER — Encounter (HOSPITAL_COMMUNITY): Payer: Self-pay

## 2019-03-21 DIAGNOSIS — Z1239 Encounter for other screening for malignant neoplasm of breast: Secondary | ICD-10-CM | POA: Insufficient documentation

## 2019-03-21 NOTE — Patient Instructions (Signed)
Explained breast self awareness with Lindwood Coke. Patient did not need a Pap smear today due to last Pap smear and HPV typing was 11/01/2018. Let patient know that her next Pap smear is due in one year due to her history of abnormal Pap smears. Referred patient to the [facility] for a screening mammogram. Appointment scheduled for Thursday, March 23, 2019 at 0910. Patient aware of appointment and will be there. Let patient know the Breast Center will follow up with her within the next couple weeks with results of her mammogram by letter or phone. Lindwood Coke verbalized understanding.  Duard Spiewak, Arvil Chaco, RN 8:09 PM

## 2019-03-21 NOTE — Progress Notes (Signed)
No complaints today.   Pap Smear: Pap smear not completed today. Last Pap smear was6/30/2020 at [facility] and normal with negative HPV. Patients previous Pap smear was 10/26/2017 in [facility] and normal with negative HPV.Patient had a colposcopy completed 01/27/2017 that was inadequate and a 58-month follow-up Pap smear recommended. Patient has a history of an abnormal Pap smear 12/31/2016 at [facility] and LGSIL with positive HPVand4/03/2016 at [facility] and ASCUS with positive HPV.Patient had a colposcopy to follow up fortheabnormal Pap smear on9/26/2018 that was benign and 6/12/2017that showed squamous dysplasia and a LEEP 10/25/2015 that showed CIN II. Last fourPap smears,twocolposcopies, and LEEP results are in EPIC.    Physical exam: Breasts Breasts symmetrical. No skin abnormalities bilateral breasts. No nipple retraction bilateral breasts. No nipple discharge bilateral breasts. No lymphadenopathy. No lumps palpated bilateral breasts. No complaints of pain or tenderness on exam. Referred patient to the [facility] for a screening mammogram. Appointment scheduled for Thursday, March 23, 2019 at 0910.        Pelvic/Bimanual No Pap smear completed today since last Pap smear and HPV typing was 11/01/2018. Pap smear not indicated per BCCCP guidelines.   Smoking History: Patientis a current smoker. Discussed smoking cessation with patient. Referred to the [facility] Quitline and gave resources to free smoking cessation classes at [facility].  Patient Navigation: Patient education provided. Access to services provided for patient through [place] program.   Colorectal Cancer Screening: Patient had a colonoscopy completed 05/09/2018. FIT Test completed 08/23/2017 that was positive. No complaints today.   Breast and Cervical Cancer Risk Assessment: Patient has no family history of breast cancer, known genetic mutations, or radiation treatment to the  chest before age 70. Patient has a history of cervical dysplasia. Patient has no history of being immunocompromised or DES exposure in-utero.  Risk Assessment    Risk Scores      03/21/2019 03/15/2018   Last edited by: Michelle Parish, RN Michelle Hang, LPN   5-year risk: 1.5 % 1.4 %   Lifetime risk: 7.4 % 7.6 %

## 2019-03-21 NOTE — Addendum Note (Signed)
Encounter addended by: Loletta Parish, RN on: 03/21/2019 8:11 PM  Actions taken: Clinical Note Signed

## 2019-03-22 IMAGING — US ULTRASOUND RIGHT BREAST LIMITED
1 series · 5 of 5 positions shown · non-contrast
Comparison: Previous exam(s).

CLINICAL DATA: Patient was called back from screening mammogram for
a possible mass in the right breast.

EXAM:
DIGITAL DIAGNOSTIC RIGHT MAMMOGRAM WITH TOMO
ULTRASOUND RIGHT BREAST

[Series 1: ultrasound right breast limited · 0.06mm/px · 5 of 5 slices shown]
[im 1/5]
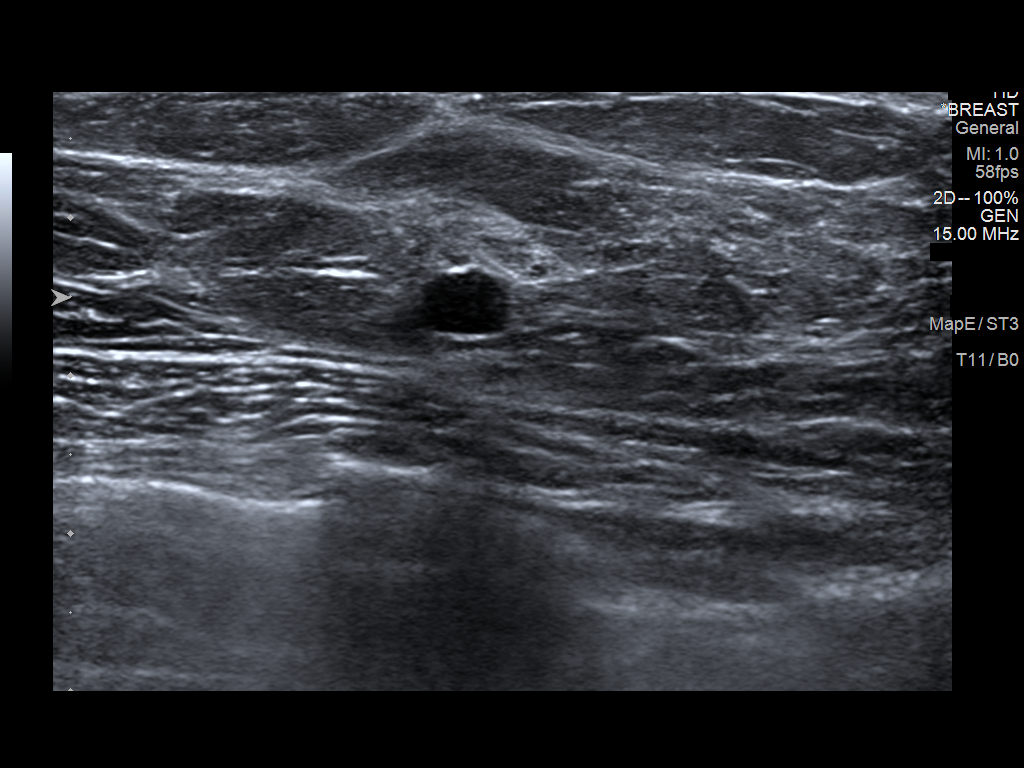
[im 2/5]
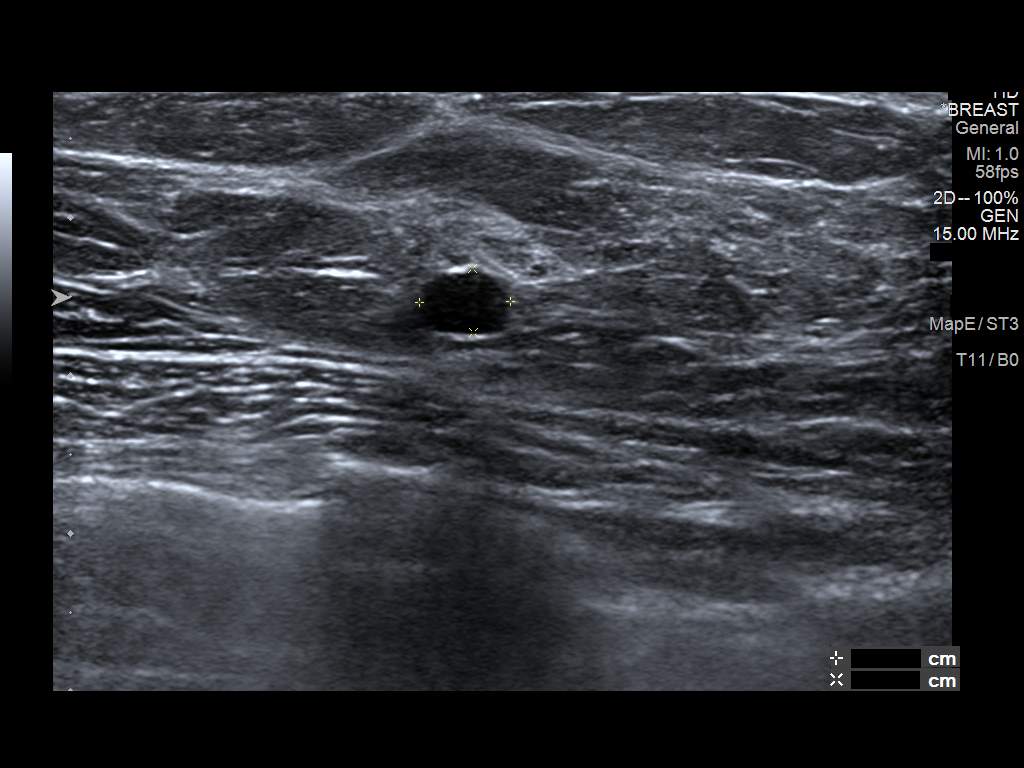
[im 3/5]
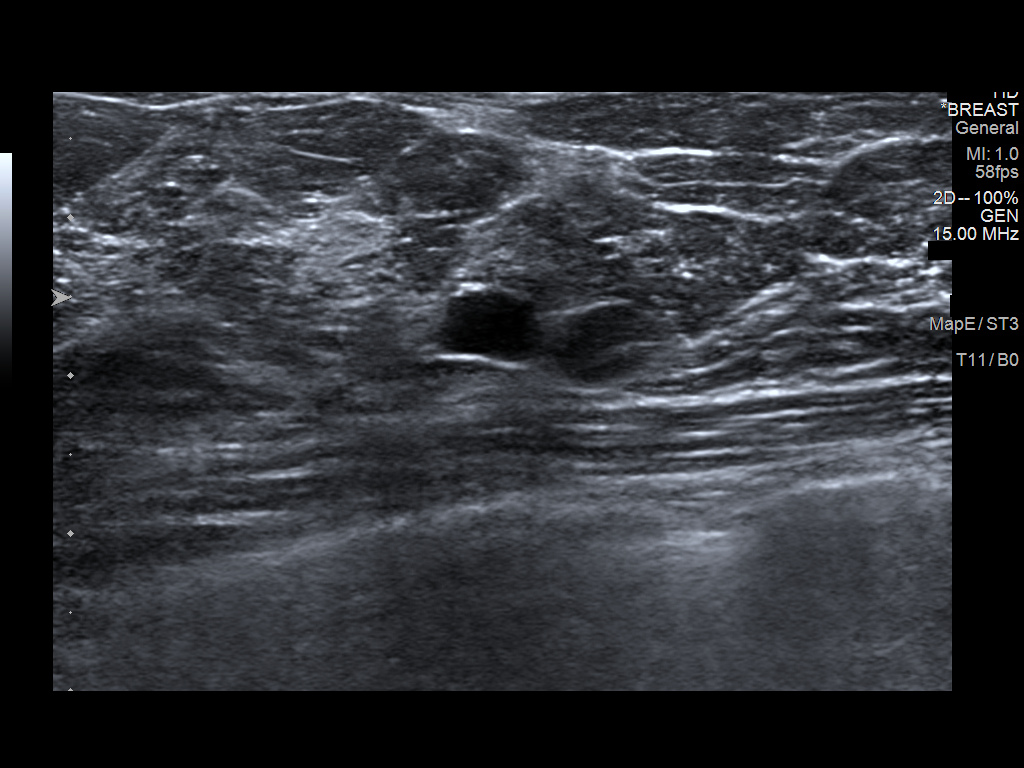
[im 4/5]
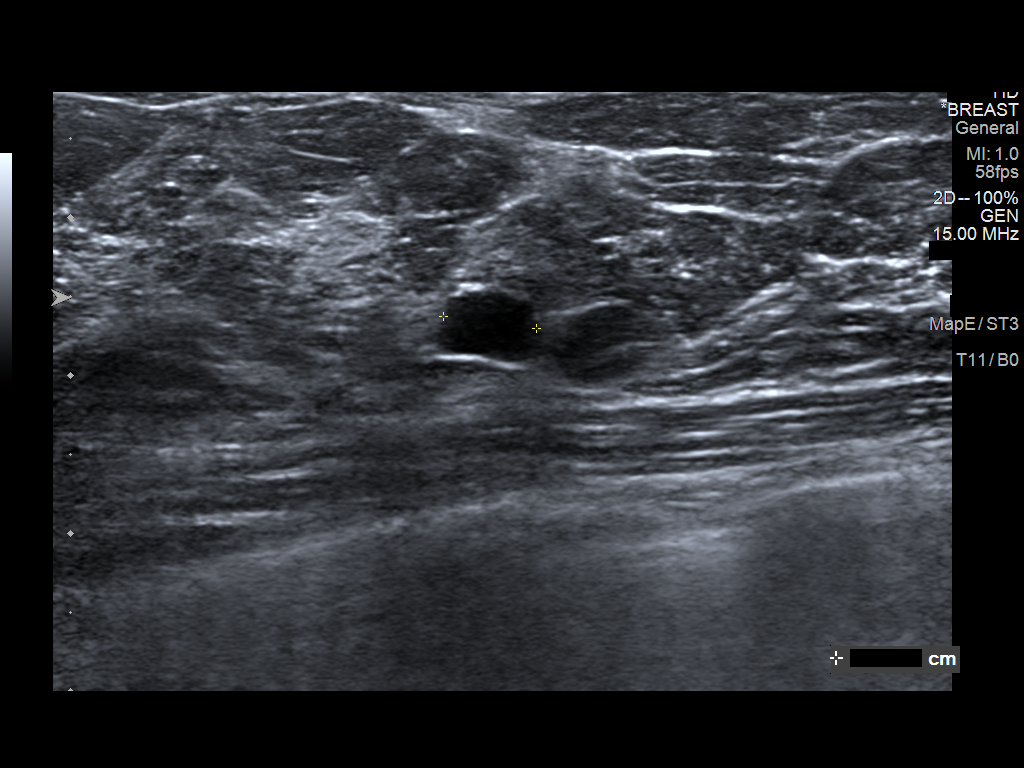
[im 5/5]
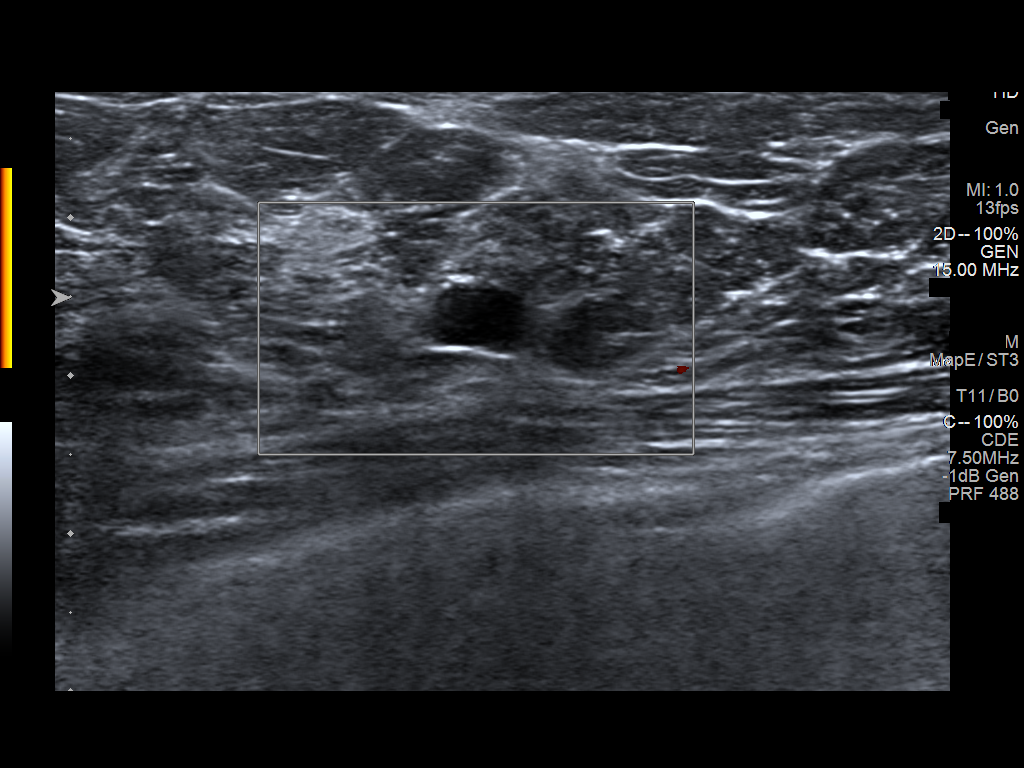

[5 of 5 positions shown; findings below may reference images not displayed]

ACR Breast Density Category b: There are scattered areas of
fibroglandular density.
FINDINGS: Additional imaging of the right breast was performed. There is a
well-circumscribed 7 mm mass in the upper-inner quadrant of the
breast. There are no malignant type microcalcifications.

On physical exam, I do not palpate a mass in the upper-inner
quadrant of the right breast.

Targeted ultrasound is performed, showing a well-circumscribed
anechoic cyst in the right breast at 1 o'clock 3 cm from the nipple
measuring 6 x 4 x 6 mm. No solid mass or abnormal shadowing
detected.
IMPRESSION: Right breast cyst.  No evidence of malignancy.

RECOMMENDATION:
Bilateral screening mammogram in 1 year is recommended.

I have discussed the findings and recommendations with the patient.
Results were also provided in writing at the conclusion of the
visit. If applicable, a reminder letter will be sent to the patient
regarding the next appointment.

BI-RADS CATEGORY  2: Benign.

## 2019-03-23 ENCOUNTER — Ambulatory Visit
Admission: RE | Admit: 2019-03-23 | Discharge: 2019-03-23 | Disposition: A | Discharge status: Home / Self Care | Payer: No Typology Code available for payment source | Source: Ambulatory Visit | Attending: Obstetrics and Gynecology | Admitting: Obstetrics and Gynecology

## 2019-03-23 ENCOUNTER — Other Ambulatory Visit: Payer: Self-pay

## 2019-03-23 ENCOUNTER — Encounter (HOSPITAL_COMMUNITY): Payer: Self-pay

## 2019-03-23 DIAGNOSIS — Z1231 Encounter for screening mammogram for malignant neoplasm of breast: Secondary | ICD-10-CM

## 2019-03-27 ENCOUNTER — Other Ambulatory Visit: Payer: Self-pay | Admitting: Obstetrics and Gynecology

## 2019-03-27 DIAGNOSIS — R928 Other abnormal and inconclusive findings on diagnostic imaging of breast: Secondary | ICD-10-CM

## 2019-03-28 ENCOUNTER — Ambulatory Visit
Admission: RE | Admit: 2019-03-28 | Discharge: 2019-03-28 | Disposition: A | Discharge status: Home / Self Care | Payer: No Typology Code available for payment source | Source: Ambulatory Visit | Attending: Obstetrics and Gynecology | Admitting: Obstetrics and Gynecology

## 2019-03-28 ENCOUNTER — Other Ambulatory Visit: Payer: Self-pay

## 2019-03-28 ENCOUNTER — Other Ambulatory Visit: Payer: Self-pay | Admitting: Obstetrics and Gynecology

## 2019-03-28 DIAGNOSIS — R928 Other abnormal and inconclusive findings on diagnostic imaging of breast: Secondary | ICD-10-CM

## 2019-07-10 ENCOUNTER — Ambulatory Visit: Payer: Self-pay | Admitting: Internal Medicine

## 2019-07-10 DIAGNOSIS — E782 Mixed hyperlipidemia: Secondary | ICD-10-CM

## 2019-07-10 DIAGNOSIS — I10 Essential (primary) hypertension: Secondary | ICD-10-CM

## 2019-07-10 DIAGNOSIS — F17200 Nicotine dependence, unspecified, uncomplicated: Secondary | ICD-10-CM

## 2019-07-10 DIAGNOSIS — F325 Major depressive disorder, single episode, in full remission: Secondary | ICD-10-CM

## 2019-07-10 MED ORDER — ATORVASTATIN CALCIUM 40 MG PO TABS: 40.0000 mg | ORAL_TABLET | Freq: Every day | ORAL | 11 refills | Status: AC

## 2019-07-10 MED ORDER — VARENICLINE TARTRATE 0.5 MG X 11 & 1 MG X 42 PO MISC: ORAL | 0 refills | Status: AC

## 2019-07-10 MED ORDER — AMLODIPINE BESYLATE 5 MG PO TABS: 5.0000 mg | ORAL_TABLET | Freq: Every day | ORAL | 2 refills | Status: AC

## 2019-07-10 MED FILL — CHANTIX STARTING MONTH BOX: 0.5 MG X 11 | 28 days supply | Qty: 53 | Fill #0

## 2019-07-10 MED FILL — ATORVASTATIN 40 MG TABLET: 40 | 30 days supply | Qty: 30 | Fill #0

## 2019-07-10 MED FILL — AMLODIPINE BESYLATE 5 MG TA: 5 | 30 days supply | Qty: 30 | Fill #0

## 2019-07-10 NOTE — Patient Instructions (Signed)
Michelle Nielsen, It was a pleasure seeing you.   Today we discussed starting a blood pressure medication which should help lower your blood pressure overall and reduce your risk of heart attack and stroke. Another important risk factor is your smoking which we are going to try Chantix for.   I also sent in another prescription for your cholesterol.   Please call if you have any issues with the Chantix or if we need to try another strategy for smoking cessation.  Also, you are able to qualify for the COVID-19 vaccine if you would like to apply for an appointment.   Take care! Dr. Koleen Distance

## 2019-07-11 ENCOUNTER — Encounter: Payer: Self-pay | Admitting: Internal Medicine

## 2019-07-11 NOTE — Assessment & Plan Note (Signed)
Patient has had persisted elevated blood pressure readings the past couple visits. She was not amenable to starting medication at her last visit, but after further discussion this visit and going over the benefits of risk reduction for various complications she is willing to start a low dose of amlodipine. Will start treatment today.

## 2019-07-11 NOTE — Progress Notes (Signed)
   CC: tobacco use disorder, hyperlipidemia   HPI:  [name] is a 59 y.o. female with history of tobacco use disorder, hyperlipidemia and elevated blood pressure who presents for follow-up on her chronic medical conditions. Please see problem based charting for further details.   Past Medical History:  Diagnosis Date  . Abnormal mammogram of right breast    having biopsy 10/29/15  . Hearing loss   . Vaginal Pap smear, abnormal    Review of Systems:  Review of Systems  Constitutional: Negative for chills, fever and weight loss.  HENT: Negative for congestion and tinnitus.   Eyes: Negative for blurred vision and discharge.  Respiratory: Negative for cough and shortness of breath.   Cardiovascular: Negative for chest pain, palpitations and leg swelling.  Gastrointestinal: Negative for abdominal pain, constipation, diarrhea, nausea and vomiting.  Genitourinary: Negative for dysuria.  Musculoskeletal: Negative for falls.  Skin: Negative for rash.  Neurological: Negative for dizziness, sensory change, focal weakness and headaches.  Psychiatric/Behavioral: Negative for depression.     Physical Exam:  Vitals:   07/10/19 1338  BP: (!) 146/91  Pulse: (!) 107  Temp: 99 F (37.2 C)  TempSrc: Oral  SpO2: 99%  Weight: 125 lb 4.8 oz (56.8 kg)   Physical Exam Constitutional:      General: She is not in acute distress.    Appearance: Normal appearance.  Eyes:     Conjunctiva/sclera: Conjunctivae normal.  Cardiovascular:     Rate and Rhythm: Normal rate and regular rhythm.     Pulses: Normal pulses.  Pulmonary:     Effort: Pulmonary effort is normal.     Breath sounds: Normal breath sounds.  Abdominal:     General: There is no distension.     Palpations: Abdomen is soft.     Tenderness: There is no abdominal tenderness.  Musculoskeletal:     Cervical back: Normal range of motion and neck supple.     Right lower leg: No edema.     Left lower leg: No edema.    Skin:    General: Skin is warm and dry.  Neurological:     General: No focal deficit present.     Mental Status: She is alert and oriented to person, place, and time.  Psychiatric:        Mood and Affect: Mood normal.        Behavior: Behavior normal.      Assessment & Plan:   See Encounters Tab for problem based charting.  Patient discussed with Dr. Dareen Piano

## 2019-07-11 NOTE — Assessment & Plan Note (Signed)
We had a long discussion regarding smoking cessation at her last visit in November, and she was ready to start Chantix. Unfortunately, she was never able to pick up this prescription, but is still interested in trying this for smoking cessation. Will re-send prescription. Encouraged her to call if she experiences any side effects or if she does not feel it is helping her and we can discuss other treatment options.

## 2019-07-11 NOTE — Assessment & Plan Note (Signed)
Patient was unfortunately not able to start statin therapy that was prescribed last visit due to cost, but she assures me this is no longer an issue and will be able to pick up new prescription today. Will send in another prescription for Lipitor 40 mg. ASCVD risk score is 29%.

## 2019-07-11 NOTE — Assessment & Plan Note (Signed)
Patient is not currently taking Prozac. PHQ-9 today is 1. Will continue to monitor off of therapy.

## 2019-07-12 NOTE — Progress Notes (Signed)
Internal Medicine Clinic Attending  Case discussed with Dr. Bloomfield at the time of the visit.  We reviewed the resident's history and exam and pertinent patient test results.  I agree with the assessment, diagnosis, and plan of care documented in the resident's note.  

## 2019-07-21 ENCOUNTER — Telehealth: Payer: Self-pay | Admitting: Internal Medicine

## 2019-07-21 NOTE — Telephone Encounter (Signed)
   Michelle Nielsen DOB: May 14, 1960 MRN: [id number]   RIDER WAIVER AND RELEASE OF LIABILITY  For purposes of improving physical access to our facilities, [facility] is pleased to partner with third parties to provide [facility] patients or other authorized individuals the option of convenient, on-demand ground transportation services (the [place]") through use of the technology service that enables users to request on-demand ground transportation from independent third-party providers.  By opting to use and accept these [employer], I, the undersigned, hereby agree on behalf of myself, and on behalf of any minor child using the [employer] for whom I am the parent or legal guardian, as follows:  1. Government social research officer provided to me are provided by independent third-party transportation providers who are not [employer] or employees and who are unaffiliated with [employer]. 2. [place] is neither a transportation carrier nor a common or public carrier. 3. [facility] has no control over the quality or safety of the transportation that occurs as a result of the [employer]. 4. [place] [facility] cannot guarantee that any third-party transportation provider will complete any arranged transportation service. 5. [facility] makes no representation, warranty, or guarantee regarding the reliability, timeliness, quality, safety, suitability, or availability of any of the Transport Services or that they will be error free. 6. I fully understand that traveling by vehicle involves risks and dangers of serious bodily injury, including permanent disability, paralysis, and death. I agree, on behalf of myself and on behalf of any minor child using the Transport Services for whom I am the parent or legal guardian, that the entire risk arising out of my use of the [employer] remains solely with me, to the maximum extent permitted under applicable law. 7. The [employer] are provided "as is" and "as available." [place] disclaims all representations and warranties, express, implied or statutory, not expressly set out in these terms, including the implied warranties of merchantability and fitness for a particular purpose. 8. I hereby waive and release [facility], its agents, employees, officers, directors, representatives, insurers, attorneys, assigns, successors, subsidiaries, and affiliates from any and all past, present, or future claims, demands, liabilities, actions, causes of action, or suits of any kind directly or indirectly arising from acceptance and use of the [employer]. 9. I further waive and release [place] and its affiliates from all present and future liability and responsibility for any injury or death to persons or damages to property caused by or related to the use of the [employer]. 10. I have read this Waiver and Release of Liability, and I understand the terms used in it and their legal significance. This Waiver is freely and voluntarily given with the understanding that my right (as well as the right of any minor child for whom I am the parent or legal guardian using the [employer]) to legal recourse against  in connection with the [employer] is knowingly surrendered in return for use of these services.   I attest that I read the consent document to [place], gave Ms. Trautmann the opportunity to ask questions and answered the questions asked (if any). I affirm that [place] then provided consent for she's participation in this program.     Drucie Ip

## 2019-07-31 ENCOUNTER — Ambulatory Visit: Payer: No Typology Code available for payment source

## 2019-10-25 ENCOUNTER — Ambulatory Visit: Payer: No Typology Code available for payment source

## 2019-11-27 ENCOUNTER — Ambulatory Visit: Payer: No Typology Code available for payment source

## 2019-11-30 ENCOUNTER — Telehealth: Payer: Self-pay

## 2019-11-30 NOTE — Telephone Encounter (Signed)
Left message for patient about rescheduling pap appointment. Left name and number for patient to call back.

## 2019-12-04 ENCOUNTER — Ambulatory Visit: Payer: No Typology Code available for payment source

## 2019-12-21 ENCOUNTER — Telehealth: Payer: Self-pay

## 2019-12-21 NOTE — Telephone Encounter (Signed)
Returned patient's phone call about rescheduling pap appointment. Left name and number for her to call back.

## 2020-02-05 ENCOUNTER — Telehealth: Payer: Self-pay

## 2020-02-05 ENCOUNTER — Other Ambulatory Visit: Payer: Self-pay | Admitting: Obstetrics and Gynecology

## 2020-02-05 DIAGNOSIS — Z1231 Encounter for screening mammogram for malignant neoplasm of breast: Secondary | ICD-10-CM

## 2020-02-05 NOTE — Telephone Encounter (Signed)
Returned patient's telephone message.  Left a voice message to call back and reschedule appointment with BCCCP.

## 2020-02-19 ENCOUNTER — Ambulatory Visit: Payer: No Typology Code available for payment source

## 2020-03-26 ENCOUNTER — Ambulatory Visit: Payer: No Typology Code available for payment source | Admitting: *Deleted

## 2020-03-26 ENCOUNTER — Other Ambulatory Visit: Payer: Self-pay

## 2020-03-26 ENCOUNTER — Ambulatory Visit
Admission: RE | Admit: 2020-03-26 | Discharge: 2020-03-26 | Disposition: A | Discharge status: Home / Self Care | Payer: No Typology Code available for payment source | Source: Ambulatory Visit | Attending: Obstetrics and Gynecology | Admitting: Obstetrics and Gynecology

## 2020-03-26 VITALS — BP 116/78 | Wt 127.8 lb

## 2020-03-26 DIAGNOSIS — Z1231 Encounter for screening mammogram for malignant neoplasm of breast: Secondary | ICD-10-CM

## 2020-03-26 DIAGNOSIS — Z01419 Encounter for gynecological examination (general) (routine) without abnormal findings: Secondary | ICD-10-CM

## 2020-03-26 NOTE — Patient Instructions (Addendum)
Explained breast self awareness with Michelle Nielsen. Pap smear completed today. Let her know that her next Pap smear will be due based on the result of today's Pap smear. Referred patient to the [facility] for a screening mammogram on mobile unit. Appointment scheduled Tuesday, March 26, 2020 at 1030. Patient escorted the mobile unit following her BCCCP for her screening mammogram. Let patient know will follow up with her within the next couple weeks with results of her Pap smear by letter or phone. Informed patient that the Breast Center will follow-up with her within the next couple of weeks with results of her mammogram by letter or phone. Discussed smoking cessation with patient. Referred to the [facility] Quitline and gave resources to free smoking cessation classes at [facility].Lujean Rave Gleason Catania verbalized understanding.  Marg Macmaster, Arvil Chaco, RN 10:09 AM

## 2020-03-26 NOTE — Progress Notes (Signed)
[name] is a 59 y.o. [id number] female who presents to [facility] clinic today with no complaints.    Pap Smear: Pap smear completed today. Last Pap smear was6/30/2020 at [facility] and normal with negative HPV. Patients previous Pap smear was 10/26/2017 in [facility] and normal with negative HPV.Patient had a colposcopy completed 01/27/2017 that was inadequate and a 54-month follow-up Pap smear recommended. Patient has a history of an abnormal Pap smear 12/31/2016 at [facility] and LGSIL with positive HPVand4/03/2016 at [facility] and ASCUS with positive HPV.Patient had a colposcopy to follow up fortheabnormal Pap smear on9/26/2018 that was benign and 6/12/2017that showed squamous dysplasia and a LEEP 10/25/2015 that showed CIN II. Last fourPap smears,twocolposcopies, and LEEP results are in EPIC.   Physical exam: Breasts Breasts symmetrical. No skin abnormalities bilateral breasts. No nipple retraction bilateral breasts. No nipple discharge bilateral breasts. No lymphadenopathy. No lumps palpated bilateral breasts. No complaints of pain or tenderness on exam.       Pelvic/Bimanual Ext Genitalia No lesions, no swelling and no discharge observed on external genitalia.        Vagina Vagina pink and normal texture. No lesions or discharge observed in vagina.        Cervix Cervix is present. Cervix pink and of normal texture. No discharge observed.    Uterus Uterus is present and palpable. Uterus in normal position and normal size.        Adnexae Bilateral ovaries present and palpable. No tenderness on palpation.         Rectovaginal No rectal exam completed today since patient had no rectal complaints. No skin abnormalities observed on exam.     Smoking History: Patientis a current smoker. Discussed smoking cessation with patient. Referred to the [facility] Quitline and gave resources to free smoking cessation classes at [facility].   Patient  Navigation: Patient education provided. Access to services provided for patient through [place] program.   Colorectal Cancer Screening: Patient had a colonoscopy completed1/10/2018.FIT Test completed 08/23/2017 that was positive.No complaints today.   Breast and Cervical Cancer Risk Assessment: Patient does not have family history of breast cancer, known genetic mutations, or radiation treatment to the chest before age 34. Patient does not have history of cervical dysplasia, immunocompromised, or DES exposure in-utero.  Risk Assessment    Risk Scores      03/26/2020 03/21/2019   Last edited by: Demetrius Revel, LPN Dale Strausser, Heath Gold, RN   5-year risk: 1.5 % 1.5 %   Lifetime risk: 7.2 % 7.4 %          A: BCCCP exam with pap smear No complaints.  P: Referred patient to the [facility] for a screening mammogram on mobile unit. Appointment scheduled Tuesday, March 26, 2020 at 1030.  Loletta Parish, RN 03/26/2020 10:08 AM

## 2020-04-02 LAB — CYTOLOGY - PAP
Adequacy: ABSENT
Comment: NEGATIVE
Diagnosis: NEGATIVE
High risk HPV: NEGATIVE

## 2020-04-05 ENCOUNTER — Telehealth: Payer: Self-pay

## 2020-04-05 NOTE — Telephone Encounter (Signed)
Patient informed negative Pap/HPV results, 3rd normal in a row, next pap due in 3 years. Patient verbalized understanding.

## 2020-04-30 ENCOUNTER — Telehealth: Payer: Self-pay

## 2020-04-30 NOTE — Telephone Encounter (Signed)
Returned patient's phone call about scheduling an appointment with the [place] program. Left name and number for patient to call back.

## 2020-04-30 NOTE — Telephone Encounter (Signed)
error 

## 2020-05-22 ENCOUNTER — Ambulatory Visit: Payer: No Typology Code available for payment source

## 2020-05-22 ENCOUNTER — Other Ambulatory Visit: Payer: No Typology Code available for payment source

## 2020-06-19 ENCOUNTER — Other Ambulatory Visit: Payer: Self-pay

## 2020-06-19 ENCOUNTER — Inpatient Hospital Stay: Payer: Self-pay | Attending: Obstetrics and Gynecology | Admitting: *Deleted

## 2020-06-19 VITALS — BP 128/90 | Ht 65.0 in | Wt 141.0 lb

## 2020-06-19 DIAGNOSIS — Z0000 Encounter for general adult medical examination without abnormal findings: Secondary | ICD-10-CM

## 2020-06-19 NOTE — Progress Notes (Signed)
[facility] initial screening     Clinical Measurement:  Vitals:   06/19/20 0833  BP: (!) 128/92   Fasting Labs Drawn Today, will review with patient when they result.   Medical History:  Patient states that she does not have high cholesterol, does not have high blood pressure and she does not have diabetes.  Medications:  Patient states that she does not take medication to lower cholesterol, blood pressure or blood sugar.  Patient does not take an aspirin a day to help prevent a heart attack or stroke.   Blood pressure, self measurement: Patient states that she does not measure blood pressure from home. She checks her blood pressure N/A. She shares her readings with a health care provider: N/A.   Nutrition: Patient states that on average she eats 2 cups of fruit and 2 cups of vegetables per day. Patient states that she does not eat fish at least 2 times per week. Patient eats less than half servings of whole grains. Patient drinks less than 36 ounces of beverages with added sugar weekly: no. Patient is currently watching sodium or salt intake: yes. In the past 7 days patient has had 0 drinks containing alcohol. On average patient drinks 0 drinks containing alcohol per day.      Physical activity:  Patient states that she gets 0 minutes of moderate and 0 minutes of vigorous physical activity each week.  Smoking status:  Patient states that she has is a current smoker .   Quality of life:  Over the past 2 weeks patient states that she had little interest or pleasure in doing things: not at all. She has been feeling down, depressed or hopeless:not at all.    Risk reduction and counseling:   Health Coaching: Explained to patient that the daily recommendation is for 2 cups of fruit and 3 cups of vegetables per day. Patient currently consumes fish once a week (salmon). Gave suggestions of other heart healthy fish that she can try and add into diet (tuna, mackerel, sardines or seabass. Patient  currently consumes whole grain cereal. Gave suggestions for other whole grains that she can try to add into diet (whole grain bread or pasta, brown rice or oatmeal). Encouraged patient to try and cut back on the amount of beverages with added sugars that she consumes. Explained that the recommendation is for 36 ounces or less per week. Patient feels as if she has gained weight over the past few months. Patient's weight is up 14 pounds from her BCCCP visit in 11/21. Talked about portion control with patient. Gave patient education materials on heart healthy diet and portion sizes. Patient has not been exercising. Encouraged patient to try and start exercising for at least 20 minutes per day.  Smoking Cessation: Patient currently smokes 1 ppd of cigarettes. Patient knows that she should quit smoking and is interested in quitting. QuitLine referral filled out and faxed over for patient. Also gave patient the program flyer for [employer] program. Explained to patient if she is interested in participating in [facility] program she can call the number on the flyer to register for a class.    Navigation:  I will notify patient of lab results.  Patient is aware of 2 more health coaching sessions and a follow up.  Time: 25 minutes

## 2020-06-20 LAB — LIPID PANEL
Chol/HDL Ratio: 3.2 ratio (ref 0.0–4.4)
Cholesterol, Total: 232 mg/dL — ABNORMAL HIGH (ref 100–199)
HDL: 73 mg/dL (ref >39)
LDL Chol Calc (NIH): 135 mg/dL — ABNORMAL HIGH (ref 0–99)
Triglycerides: 139 mg/dL (ref 0–149)
VLDL Cholesterol Cal: 24 mg/dL (ref 5–40)

## 2020-06-20 LAB — HEMOGLOBIN A1C
Est. average glucose Bld gHb Est-mCnc: 126 mg/dL
Hgb A1c MFr Bld: 6 % — ABNORMAL HIGH (ref 4.8–5.6)

## 2020-06-20 LAB — GLUCOSE, RANDOM: Glucose: 91 mg/dL (ref 65–99)

## 2020-06-25 ENCOUNTER — Telehealth: Payer: Self-pay

## 2020-06-25 NOTE — Telephone Encounter (Signed)
Health coaching 2     Labs- 232 cholesterol, 135 LDL cholesterol, 139 triglycerides, 73 HDL cholesterol, 6.0 hemoglobin A1C, 103 mean plasma glucose. Patient understands and is aware of her lab results.   Goals-  1. Reduce the amount of fried and fatty foods consumed. Try to grill, bake, broil or sautee foods instead.  2. Reduce the amount of red meats consumed. Try to substitute for lean protein like chicken or fish. 3. Increase the amount of whole grains and heart healthy fish consumed. 4. Cut back on sweets consumed in both food and drinks.  5. Reduce the amount of carbs consumed. Discussed the daily recommendation for the grain food group. 6. Try and start exercising for at least 20 minutes a day.  Practice diabetes diet when preparing and plating foods.   Navigation:  Patient is aware of 1 more health coaching sessions and a follow up. Patient is scheduled for follow-up appointment with Internal Medicine on July 08, 2020 @ 3:15 pm. Offered to enroll patient in the "Eat Smart Move More" diabetes prevention program. Patient will call back if she would like me to register her for one of the classes.  Time- 10 minutes

## 2020-07-03 ENCOUNTER — Other Ambulatory Visit: Payer: No Typology Code available for payment source

## 2020-07-08 ENCOUNTER — Encounter: Payer: No Typology Code available for payment source | Admitting: Student

## 2021-04-21 ENCOUNTER — Other Ambulatory Visit: Payer: Self-pay

## 2021-04-21 DIAGNOSIS — Z1231 Encounter for screening mammogram for malignant neoplasm of breast: Secondary | ICD-10-CM

## 2021-05-01 ENCOUNTER — Ambulatory Visit
Admission: RE | Admit: 2021-05-01 | Discharge: 2021-05-01 | Disposition: A | Discharge status: Home / Self Care | Payer: No Typology Code available for payment source | Source: Ambulatory Visit | Attending: Obstetrics and Gynecology | Admitting: Obstetrics and Gynecology

## 2021-05-01 ENCOUNTER — Ambulatory Visit: Payer: Self-pay | Admitting: *Deleted

## 2021-05-01 ENCOUNTER — Other Ambulatory Visit: Payer: Self-pay

## 2021-05-01 VITALS — BP 116/72 | Wt 129.7 lb

## 2021-05-01 DIAGNOSIS — Z1231 Encounter for screening mammogram for malignant neoplasm of breast: Secondary | ICD-10-CM

## 2021-05-01 DIAGNOSIS — Z1239 Encounter for other screening for malignant neoplasm of breast: Secondary | ICD-10-CM

## 2021-05-01 NOTE — Patient Instructions (Addendum)
Explained breast self awareness with Michelle Nielsen. Patient did not need a Pap smear today due to last Pap smear and HPV Typing was 03/26/2020. Let her know that her next Pap smear is due in 3 years due to her history. Referred patient to the [place] for a screening mammogram on mobile unit. Appointment scheduled Thursday, May 01, 2021 at 1530. Patient escorted to the mobile unit following BCCCP for her screening mammogram. Let patient know the [facility] will follow up with her within the next couple weeks with results of her mammogram by letter or phone. Discussed smoking cessation with patient. Referred to the [facility] Quitline and gave resources to free smoking cessation classes at [facility]. Jozlynn Gleason Batta verbalized understanding.  Laketra Bowdish, Arvil Chaco, RN 2:41 PM

## 2021-05-01 NOTE — Progress Notes (Signed)
Michelle Nielsen is a 60 y.o. female who presents to [facility] clinic today with no complaints.    Pap Smear: Pap smear not completed today. Last Pap smear was 03/26/2020 at [facility] clinic and normal with negative HPV. Patients previous Pap smears 11/01/2018 was normal with negative HPV and 10/26/2017 normal with negative HPV. Patient has a history of an abnormal Pap smear 12/31/2016 at [facility] and [facility] with positive HPV and 08/13/2015 at [facility] and [facility] with positive HPV. Patient had a colposcopy to follow up for the abnormal Pap smear on 01/27/2017 that was benign and 10/14/2015 that showed squamous dysplasia and a LEEP 10/25/2015 that showed CIN II. Last five Pap smears, two colposcopies, and LEEP results are in EPIC.      Physical exam: Breasts Breasts symmetrical. No skin abnormalities bilateral breasts. No nipple retraction bilateral breasts. No nipple discharge bilateral breasts. No lymphadenopathy. No lumps palpated bilateral breasts. No complaints of pain or tenderness on exam.  MS DIGITAL SCREENING BILATERAL  Result Date: 12/31/2016 CLINICAL DATA:  Screening. EXAM: DIGITAL SCREENING BILATERAL MAMMOGRAM WITH CAD COMPARISON:  Previous exam(s). ACR Breast Density Category b: There are scattered areas of fibroglandular density. FINDINGS: There are no findings suspicious for malignancy. Images were processed with CAD. IMPRESSION: No mammographic evidence of malignancy. A result letter of this screening mammogram will be mailed directly to the patient. RECOMMENDATION: Screening mammogram in one year. (Code:SM-B-01Y) BI-RADS CATEGORY  1: Negative. Electronically Signed   By: Franki Cabot M.D.   On: 12/31/2016 15:52   MM DIAG BREAST TOMO UNI RIGHT  Result Date: 05/25/2016 CLINICAL DATA:  Six month follow-up of the right breast after a benign ultrasound-guided right breast biopsy. Pathology showed fibrocystic changes an usual ductal hyperplasia. EXAM: 2D DIGITAL  DIAGNOSTIC UNILATERAL RIGHT MAMMOGRAM WITH CAD AND ADJUNCT TOMO COMPARISON:  October 29, 2015, October 24, 2015, October 10, 2015, January 14, 2006 ACR Breast Density Category b: There are scattered areas of fibroglandular density. FINDINGS: There is a stable 7 mm mass in the lower outer quadrant right breast, which was previously biopsied under ultrasound guidance in June 2017. The ribbon shaped biopsy clip which is approximately 8 mm anterior and lateral to the mass is stable in position. No new or suspicious mass, suspicious microcalcification, or architectural distortion is identified the right breast. Mammographic images were processed with CAD. IMPRESSION: Stable appearance of previously biopsied 7 mm mass in the lower outer quadrant of the right breast. Pathology results were benign. The patient can return to the screening population RECOMMENDATION: Bilateral screening mammogram is recommended in June 2018. I have discussed the findings and recommendations with the patient. Results were also provided in writing at the conclusion of the visit. If applicable, a reminder letter will be sent to the patient regarding the next appointment. BI-RADS CATEGORY  2: Benign. Electronically Signed   By: Curlene Dolphin M.D.   On: 05/25/2016 12:12   MS DIGITAL SCREENING TOMO BILATERAL  Result Date: 03/30/2020 CLINICAL DATA:  Screening. EXAM: DIGITAL SCREENING BILATERAL MAMMOGRAM WITH TOMO AND CAD COMPARISON:  Previous exam(s). ACR Breast Density Category b: There are scattered areas of fibroglandular density. FINDINGS: There are no findings suspicious for malignancy. Images were processed with CAD. IMPRESSION: No mammographic evidence of malignancy. A result letter of this screening mammogram will be mailed directly to the patient. RECOMMENDATION: Screening mammogram in one year. (Code:SM-B-01Y) BI-RADS CATEGORY  1: Negative. Electronically Signed   By: Franki Cabot M.D.   On: 03/30/2020  07:49   MS DIGITAL SCREENING TOMO  BILATERAL  Result Date: 03/24/2019 CLINICAL DATA:  Screening. EXAM: DIGITAL SCREENING BILATERAL MAMMOGRAM WITH TOMO AND CAD COMPARISON:  Previous exam(s). ACR Breast Density Category b: There are scattered areas of fibroglandular density. FINDINGS: In the right breast, a possible mass warrants further evaluation. In the left breast, no findings suspicious for malignancy. Images were processed with CAD. IMPRESSION: Further evaluation is suggested for possible mass in the right breast. RECOMMENDATION: Diagnostic mammogram and possibly ultrasound of the right breast. (Code:[id number]-47M) The patient will be contacted regarding the findings, and additional imaging will be scheduled. BI-RADS CATEGORY  0: Incomplete. Need additional imaging evaluation and/or prior mammograms for comparison. Electronically Signed   By: Lillia Mountain M.D.   On: 03/24/2019 11:19   MS DIGITAL SCREENING TOMO BILATERAL  Result Date: 03/16/2018 CLINICAL DATA:  Screening. EXAM: DIGITAL SCREENING BILATERAL MAMMOGRAM WITH TOMO AND CAD COMPARISON:  Previous exam(s). ACR Breast Density Category b: There are scattered areas of fibroglandular density. FINDINGS: In the right breast, a possible mass warrants further evaluation. In the left breast, no findings suspicious for malignancy. Images were processed with CAD. IMPRESSION: Further evaluation is suggested for possible mass in the right breast. RECOMMENDATION: Diagnostic mammogram and possibly ultrasound of the right breast. (Code:[id number]-47M) The patient will be contacted regarding the findings, and additional imaging will be scheduled. BI-RADS CATEGORY  0: Incomplete. Need additional imaging evaluation and/or prior mammograms for comparison. Electronically Signed   By: Ammie Ferrier M.D.   On: 03/16/2018 10:15   MS DIGITAL DIAG TOMO UNI RIGHT  Result Date: 03/28/2019 CLINICAL DATA:  Patient presents today recall from screening for a possible right breast mass. EXAM: DIGITAL DIAGNOSTIC RIGHT  MAMMOGRAM ULTRASOUND RIGHT BREAST COMPARISON:  Previous exam(s). ACR Breast Density Category b: There are scattered areas of fibroglandular density. FINDINGS: Mammogram: Additional spot compression views were performed for the questioned mass in the right breast. On the additional imaging there is persistence of an oval circumscribed mass measuring approximately 1.4 cm in the upper inner quadrant of the right breast. Ultrasound: Targeted ultrasound is performed in the right breast at 1 o'clock 3 cm from the nipple demonstrating an oval circumscribed anechoic mass measuring 1.1 x 0.8 x 1.3 cm, corresponding to the mammographic finding. This is consistent with a simple cyst. There is no internal blood flow. IMPRESSION: Right breast mass at 1 o'clock measuring 1.3 cm is consistent with a benign simple cyst. RECOMMENDATION: Screening mammogram in one year.(Code:SM-[id number]) I have discussed the findings and recommendations with the patient. If applicable, a reminder letter will be sent to the patient regarding the next appointment. BI-RADS CATEGORY  2: Benign. Electronically Signed   By: Audie Pinto M.D.   On: 03/28/2019 10:30   MS DIGITAL DIAG TOMO UNI RIGHT  Result Date: 03/18/2018 CLINICAL DATA:  Patient was called back from screening mammogram for a possible mass in the right breast. EXAM: DIGITAL DIAGNOSTIC RIGHT MAMMOGRAM WITH TOMO ULTRASOUND RIGHT BREAST COMPARISON:  Previous exam(s). ACR Breast Density Category b: There are scattered areas of fibroglandular density. FINDINGS: Additional imaging of the right breast was performed. There is a well-circumscribed 7 mm mass in the upper-inner quadrant of the breast. There are no malignant type microcalcifications. On physical exam, I do not palpate a mass in the upper-inner quadrant of the right breast. Targeted ultrasound is performed, showing a well-circumscribed anechoic cyst in the right breast at 1 o'clock 3 cm from the nipple measuring 6 x 4 x 6 mm.  No  solid mass or abnormal shadowing detected. IMPRESSION: Right breast cyst.  No evidence of malignancy. RECOMMENDATION: Bilateral screening mammogram in 1 year is recommended. I have discussed the findings and recommendations with the patient. Results were also provided in writing at the conclusion of the visit. If applicable, a reminder letter will be sent to the patient regarding the next appointment. BI-RADS CATEGORY  2: Benign. Electronically Signed   By: Lillia Mountain M.D.   On: 03/18/2018 15:02         Pelvic/Bimanual Pap is not indicated today per BCCCP guidelines.   Smoking History: Patient is a current smoker. Discussed smoking cessation with patient. Referred to the [facility] Quitline and gave resources to free smoking cessation classes at [facility].   Patient Navigation: Patient education provided. Access to services provided for patient through [place] program.   Colorectal Cancer Screening: Patient had a colonoscopy completed 05/09/2018. FIT Test completed 08/23/2017 that was positive. No complaints today.  Breast and Cervical Cancer Risk Assessment: Patient does not have family history of breast cancer, known genetic mutations, or radiation treatment to the chest before age 71. Patient has history of cervical dysplasia. Patient has no history of being immunocompromised or DES exposure in-utero.  Risk Assessment     Risk Scores       05/01/2021 03/26/2020   Last edited by: Demetrius Revel, LPN McGill, Sherie Mamie Nick, LPN   5-year risk: 1.5 % 1.5 %   Lifetime risk: 7 % 7.2 %            A: BCCCP exam without pap smear No complaints.  P: Referred patient to the [facility] for a screening mammogram on mobile unit. Appointment scheduled Thursday, May 01, 2021 at 1530.  Loletta Parish, RN 05/01/2021 2:41 PM

## 2021-10-07 ENCOUNTER — Telehealth: Payer: Self-pay | Admitting: Internal Medicine

## 2021-10-07 ENCOUNTER — Encounter: Payer: Self-pay | Admitting: Internal Medicine

## 2021-10-07 NOTE — Telephone Encounter (Signed)
LEC colon please Thanks

## 2021-10-07 NOTE — Telephone Encounter (Signed)
Patient was scheduled for PV on 7/5 at 1:00 and colon procedure at the [facility] on 8/1 at 8:00

## 2021-10-07 NOTE — Telephone Encounter (Signed)
Inbound call from patient stating she needed to schedule her recall colonoscopy. Patient stated that she was unsure if she needed to have her procedure at our office or at the hospital. Patient is seeking advice on what Dr. Hilarie Fredrickson would like for her to do. Please advise.

## 2021-10-07 NOTE — Telephone Encounter (Signed)
Please see note below regarding recall colon. Last procedure was done in the [facility]. Please advise.

## 2021-11-05 ENCOUNTER — Ambulatory Visit (AMBULATORY_SURGERY_CENTER): Payer: Self-pay | Admitting: *Deleted

## 2021-11-05 VITALS — Ht 65.0 in | Wt 140.0 lb

## 2021-11-05 DIAGNOSIS — Z86010 Personal history of colonic polyps: Secondary | ICD-10-CM

## 2021-11-05 NOTE — Progress Notes (Signed)
No egg or soy allergy known to patient  No issues known to pt with past sedation with any surgeries or procedures Patient denies ever being told they had issues or difficulty with intubation  No FH of Malignant Hyperthermia Pt is not on diet pills Pt is not on  home 02  Pt is not on blood thinners   No A fib or A flutter Plenvu sample given to pt in PV today  PV completed in person. Pt verified name, DOB.  Procedure explained to pt. Prep instructions reviewed, questions answered. Pt encouraged to call with questions or issues.  If pt has My chart, procedure instructions sent via My Chart

## 2021-12-02 ENCOUNTER — Encounter: Payer: No Typology Code available for payment source | Admitting: Internal Medicine

## 2021-12-11 ENCOUNTER — Encounter: Payer: Self-pay | Admitting: Certified Registered Nurse Anesthetist

## 2021-12-18 ENCOUNTER — Encounter: Payer: Self-pay | Admitting: Internal Medicine

## 2021-12-18 ENCOUNTER — Ambulatory Visit (AMBULATORY_SURGERY_CENTER): Payer: Self-pay | Admitting: Internal Medicine

## 2021-12-18 VITALS — BP 117/72 | HR 70 | Temp 98.0°F | Resp 13 | Ht 65.0 in | Wt 140.0 lb

## 2021-12-18 DIAGNOSIS — Z09 Encounter for follow-up examination after completed treatment for conditions other than malignant neoplasm: Secondary | ICD-10-CM

## 2021-12-18 DIAGNOSIS — Z86010 Personal history of colonic polyps: Secondary | ICD-10-CM

## 2021-12-18 MED ORDER — SODIUM CHLORIDE 0.9 % IV SOLN: 500.0000 mL | Freq: Once | INTRAVENOUS | Status: DC

## 2021-12-18 NOTE — Patient Instructions (Signed)
Handout given on diverticulosis.  YOU HAD AN ENDOSCOPIC PROCEDURE TODAY AT [facility]:   Refer to the procedure report that was given to you for any specific questions about what was found during the examination.  If the procedure report does not answer your questions, please call your gastroenterologist to clarify.  If you requested that your care partner not be given the details of your procedure findings, then the procedure report has been included in a sealed envelope for you to review at your convenience later.  YOU SHOULD EXPECT: Some feelings of bloating in the abdomen. Passage of more gas than usual.  Walking can help get rid of the air that was put into your GI tract during the procedure and reduce the bloating. If you had a lower endoscopy (such as a colonoscopy or flexible sigmoidoscopy) you may notice spotting of blood in your stool or on the toilet paper. If you underwent a bowel prep for your procedure, you may not have a normal bowel movement for a few days.  Please Note:  You might notice some irritation and congestion in your nose or some drainage.  This is from the oxygen used during your procedure.  There is no need for concern and it should clear up in a day or so.  SYMPTOMS TO REPORT IMMEDIATELY:  Following lower endoscopy (colonoscopy or flexible sigmoidoscopy):  Excessive amounts of blood in the stool  Significant tenderness or worsening of abdominal pains  Swelling of the abdomen that is new, acute  Fever of 100F or higher   For urgent or emergent issues, a gastroenterologist can be reached at any hour by calling [phone]. Do not use MyChart messaging for urgent concerns.    DIET:  We do recommend a small meal at first, but then you may proceed to your regular diet.  Drink plenty of fluids but you should avoid alcoholic beverages for 24 hours.  ACTIVITY:  You should plan to take it easy for the rest of today and you should NOT DRIVE or use heavy  machinery until tomorrow (because of the sedation medicines used during the test).    FOLLOW UP: Our staff will call the number listed on your records the next business day following your procedure.  We will call around 7:15- 8:00 am to check on you and address any questions or concerns that you may have regarding the information given to you following your procedure. If we do not reach you, we will leave a message.  If you develop any symptoms (ie: fever, flu-like symptoms, shortness of breath, cough etc.) before then, please call [phone].  If you test positive for Covid 19 in the 2 weeks post procedure, please call and report this information to [place].    If any biopsies were taken you will be contacted by phone or by letter within the next 1-3 weeks.  Please call us at [phone] if you have not heard about the biopsies in 3 weeks.    SIGNATURES/CONFIDENTIALITY: You and/or your care partner have signed paperwork which will be entered into your electronic medical record.  These signatures attest to the fact that that the information above on your After Visit Summary has been reviewed and is understood.  Full responsibility of the confidentiality of this discharge information lies with you and/or your care-partner.

## 2021-12-18 NOTE — Progress Notes (Signed)
GASTROENTEROLOGY PROCEDURE H&P NOTE   Primary Care Physician: Pcp, No    Reason for Procedure:  History of adenomatous colon polyps  Plan:    Colonoscopy  Patient is appropriate for endoscopic procedure(s) in the ambulatory ([place]) setting.  The nature of the procedure, as well as the risks, benefits, and alternatives were carefully and thoroughly reviewed with the patient. Ample time for discussion and questions allowed. The patient understood, was satisfied, and agreed to proceed.     HPI: Michelle Nielsen is a 61 y.o. female who presents for a once colonoscopy.  Medical history as below.  Tolerated the prep.  No recent chest pain or shortness of breath.  No abdominal pain today.  Past Medical History:  Diagnosis Date   Abnormal mammogram of right breast    having biopsy 10/29/15   Hearing loss    Vaginal Pap smear, abnormal     Past Surgical History:  Procedure Laterality Date   BREAST BIOPSY Right 2017   benign   CESAREAN SECTION     COLONOSCOPY WITH PROPOFOL N/A 05/09/2018   Procedure: COLONOSCOPY WITH PROPOFOL;  Surgeon: Jerene Bears, MD;  Location: [facility];  Service: Gastroenterology;  Laterality: N/A;   INNER EAR SURGERY     LEEP  2017   Everything is fine now.   TONSILLECTOMY      Prior to Admission medications   Medication Sig Start Date End Date Taking? Authorizing Provider  acetaminophen (TYLENOL) 500 MG tablet Take 500 mg by mouth every 6 (six) hours as needed for moderate pain or headache. Patient not taking: Reported on 06/19/2020    [provider]  amLODipine (NORVASC) 5 MG tablet Take 1 tablet (5 mg total) by mouth daily. Patient not taking: Reported on 06/19/2020 07/10/19   Modena Nunnery D, DO  atorvastatin (LIPITOR) 40 MG tablet Take 1 tablet (40 mg total) by mouth daily. Patient not taking: Reported on 06/19/2020 07/10/19   Modena Nunnery D, DO  fluticasone (FLONASE) 50 MCG/ACT nasal spray Place 1 spray into both nostrils  daily. Patient not taking: Reported on 11/01/2018 08/19/18 08/19/19  Ina Homes, MD  loratadine (CLARITIN) 10 MG tablet Take 10 mg by mouth daily as needed for allergies. Patient not taking: Reported on 06/19/2020    [provider]  Multiple Vitamin (MULTIVITAMIN WITH MINERALS) TABS tablet Take 1 tablet by mouth daily. Patient not taking: Reported on 06/19/2020    [provider]  varenicline (CHANTIX PAK) 0.5 MG X 11 & 1 MG X 42 tablet Take 1 0.5 mg tab by mouth once daily for 3 days, then 1 0.5 mg tab twice daily for 4 days, then 1 mg tablet twice daily. Patient not taking: Reported on 06/19/2020 07/10/19   Modena Nunnery D, DO    Current Outpatient Medications  Medication Sig Dispense Refill   acetaminophen (TYLENOL) 500 MG tablet Take 500 mg by mouth every 6 (six) hours as needed for moderate pain or headache. (Patient not taking: Reported on 06/19/2020)     amLODipine (NORVASC) 5 MG tablet Take 1 tablet (5 mg total) by mouth daily. (Patient not taking: Reported on 06/19/2020) 30 tablet 2   atorvastatin (LIPITOR) 40 MG tablet Take 1 tablet (40 mg total) by mouth daily. (Patient not taking: Reported on 06/19/2020) 30 tablet 11   fluticasone (FLONASE) 50 MCG/ACT nasal spray Place 1 spray into both nostrils daily. (Patient not taking: Reported on 11/01/2018) 8 g 0   loratadine (CLARITIN) 10 MG tablet Take 10  mg by mouth daily as needed for allergies. (Patient not taking: Reported on 06/19/2020)     Multiple Vitamin (MULTIVITAMIN WITH MINERALS) TABS tablet Take 1 tablet by mouth daily. (Patient not taking: Reported on 06/19/2020)     varenicline (CHANTIX PAK) 0.5 MG X 11 & 1 MG X 42 tablet Take 1 0.5 mg tab by mouth once daily for 3 days, then 1 0.5 mg tab twice daily for 4 days, then 1 mg tablet twice daily. (Patient not taking: Reported on 06/19/2020) 42 tablet 0   Current Facility-Administered Medications  Medication Dose Route Frequency Provider Last Rate Last Admin   0.9 %   sodium chloride infusion  500 mL Intravenous Once Aidee Latimore, Lajuan Lines, MD        Allergies as of 12/18/2021 - Review Complete 12/18/2021  Allergen Reaction Noted   Sulfonamide derivatives  02/21/2009    Family History  Problem Relation Age of Onset   Heart disease Father    Colon polyps Sister    Colon cancer Maternal Aunt    Esophageal cancer Neg Hx    Rectal cancer Neg Hx    Stomach cancer Neg Hx     Social History   Socioeconomic History   Marital status: Single    Spouse name: Not on file   Number of children: 3   Years of education: Not on file   Highest education level: Associate degree: academic program  Occupational History   Not on file  Tobacco Use   Smoking status: Every Day    Packs/day: 1.00    Years: 42.00    Total pack years: 42.00    Types: Cigarettes   Smokeless tobacco: Never  Vaping Use   Vaping Use: Never used  Substance and Sexual Activity   Alcohol use: Not Currently    Comment: socially   Drug use: No   Sexual activity: Yes    Partners: Male    Birth control/protection: Post-menopausal  Other Topics Concern   Not on file  Social History Narrative   Not on file   Social Determinants of Health   Financial Resource Strain: Not on file  Food Insecurity: No Food Insecurity (05/01/2021)   Hunger Vital Sign    Worried About Running Out of Food in the Last Year: Never true    Ran Out of Food in the Last Year: Never true  Transportation Needs: No Transportation Needs (05/01/2021)   PRAPARE - Hydrologist (Medical): No    Lack of Transportation (Non-Medical): No  Physical Activity: Not on file  Stress: Not on file  Social Connections: Not on file  Intimate Partner Violence: Not on file    Physical Exam: Vital signs in last 24 hours: '@BP'$  125/86   Pulse 76   Temp 98 F (36.7 C) (Temporal)   Resp 13   Ht '5\' 5"'$  (1.651 m)   Wt 140 lb (63.5 kg)   SpO2 95%   BMI 23.30 kg/m  GEN: NAD EYE: Sclerae anicteric ENT:  MMM CV: Non-tachycardic Pulm: CTA b/l GI: Soft, NT/ND NEURO:  Alert & Oriented x 3   Zenovia Jarred, MD [facility]  12/18/2021 8:43 AM

## 2021-12-18 NOTE — Progress Notes (Signed)
Report given to PACU, vss 

## 2021-12-18 NOTE — Progress Notes (Signed)
Pt's states no medical or surgical changes since previsit or office visit. 

## 2021-12-18 NOTE — Op Note (Signed)
Michelle Nielsen Patient Name: Michelle Nielsen Procedure Date: 12/18/2021 8:27 AM MRN: [id number] Endoscopist: Jerene Bears , MD Age: 61 Referring MD:  Date of Birth: 06/01/1960 Gender: Female Account #: [id number] Procedure:                Colonoscopy Indications:              High risk colon cancer surveillance: Personal                            history of high risk adenoma (10 mm or greater in                            size) requiring previous EMR, Last colonoscopy:                            April 2020; previous AIN treated at [facility]:                Monitored Anesthesia Care Procedure:                Pre-Anesthesia Assessment:                           - Prior to the procedure, a History and Physical                            was performed, and patient medications and                            allergies were reviewed. The patient's tolerance of                            previous anesthesia was also reviewed. The risks                            and benefits of the procedure and the sedation                            options and risks were discussed with the patient.                            All questions were answered, and informed consent                            was obtained. Prior Anticoagulants: The patient has                            taken no previous anticoagulant or antiplatelet                            agents. ASA Grade Assessment: II - A patient with                            mild systemic disease. After reviewing the risks  and benefits, the patient was deemed in                            satisfactory condition to undergo the procedure.                           After obtaining informed consent, the colonoscope                            was passed under direct vision. Throughout the                            procedure, the patient's blood pressure, pulse, and                            oxygen saturations were  monitored continuously. The                            PCF-HQ190L Colonoscope was introduced through the                            anus and advanced to the cecum, identified by                            appendiceal orifice and ileocecal valve. The                            colonoscopy was performed without difficulty. The                            patient tolerated the procedure well. The quality                            of the bowel preparation was good. The ileocecal                            valve, appendiceal orifice, and rectum were                            photographed. Scope In: 8:47:08 AM Scope Out: 9:00:59 AM Scope Withdrawal Time: 0 hours 10 minutes 4 seconds  Total Procedure Duration: 0 hours 13 minutes 51 seconds  Findings:                 The digital rectal exam was normal.                           Scattered small-mouthed diverticula were found in                            the sigmoid colon, descending colon, transverse                            colon and ascending colon.  A tattoo was seen in the transverse colon. A                            post-polypectomy scar was found at the tattoo site.                            There was no evidence of residual polyp tissue.                           An area of mildly nodular mucosa was found at the                            anus.                           No additional abnormalities were found on                            retroflexion. Complications:            No immediate complications. Estimated Blood Loss:     Estimated blood loss: none. Impression:               - Mild diverticulosis in the sigmoid colon, in the                            descending colon, in the transverse colon and in                            the ascending colon.                           - A tattoo was seen in the transverse colon. A                            post-polypectomy scar was found at the tattoo site.                             There was no evidence of residual polyp tissue.                           - Nodular mucosa at the anus.                           - No specimens collected. Recommendation:           - Patient has a contact number available for                            emergencies. The signs and symptoms of potential                            delayed complications were discussed with the  patient. Return to normal activities tomorrow.                            Written discharge instructions were provided to the                            patient.                           - Resume previous diet.                           - Continue present medications.                           - Follow-up recommended at [facility] with Dr. Remonia Richter                            given AIN history for anal pap.                           - Repeat colonoscopy in 5 years for surveillance. Jerene Bears, MD 12/18/2021 9:11:27 AM This report has been signed electronically.

## 2021-12-19 ENCOUNTER — Telehealth: Payer: Self-pay | Admitting: *Deleted

## 2021-12-19 NOTE — Telephone Encounter (Signed)
  Follow up Call-     12/18/2021    7:59 AM  Call back number  Post procedure Call Back phone  # [phone]  Permission to leave phone message Yes     Patient questions:  Message left to call us if necessary.

## 2022-04-30 ENCOUNTER — Telehealth: Payer: Self-pay

## 2022-04-30 ENCOUNTER — Other Ambulatory Visit: Payer: Self-pay | Admitting: Obstetrics and Gynecology

## 2022-04-30 DIAGNOSIS — Z1231 Encounter for screening mammogram for malignant neoplasm of breast: Secondary | ICD-10-CM

## 2022-04-30 NOTE — Telephone Encounter (Signed)
Telephoned patient at mobile number. Left a voice message with [facility] contact information. 

## 2022-07-08 ENCOUNTER — Telehealth: Payer: Self-pay | Admitting: Internal Medicine

## 2022-07-08 NOTE — Telephone Encounter (Signed)
Inbound call from patient, states [facility] Dr. Evern Core office is stating she needs a referral to follow up care with them. Per her last procedure with Dr. Hilarie Fredrickson.

## 2022-07-09 ENCOUNTER — Other Ambulatory Visit: Payer: Self-pay | Admitting: Family Medicine

## 2022-07-09 DIAGNOSIS — F17210 Nicotine dependence, cigarettes, uncomplicated: Secondary | ICD-10-CM

## 2022-07-09 DIAGNOSIS — Z122 Encounter for screening for malignant neoplasm of respiratory organs: Secondary | ICD-10-CM

## 2022-07-09 NOTE — Telephone Encounter (Signed)
Left message for pt that the referral has been sent to [facility] and that she can try to call their office next week regarding an appt.

## 2022-07-16 ENCOUNTER — Ambulatory Visit: Payer: Medicaid Other

## 2022-07-17 ENCOUNTER — Ambulatory Visit: Payer: Self-pay | Admitting: Family Medicine

## 2022-07-30 ENCOUNTER — Other Ambulatory Visit: Payer: Self-pay | Admitting: Family Medicine

## 2022-07-30 ENCOUNTER — Ambulatory Visit
Admission: RE | Admit: 2022-07-30 | Discharge: 2022-07-30 | Disposition: A | Discharge status: Home / Self Care | Payer: Medicaid Other | Source: Ambulatory Visit | Attending: Family Medicine | Admitting: Family Medicine

## 2022-07-30 ENCOUNTER — Ambulatory Visit: Payer: Medicaid Other

## 2022-07-30 DIAGNOSIS — Z1231 Encounter for screening mammogram for malignant neoplasm of breast: Secondary | ICD-10-CM

## 2022-08-11 ENCOUNTER — Ambulatory Visit
Admission: RE | Admit: 2022-08-11 | Discharge: 2022-08-11 | Disposition: A | Discharge status: Home / Self Care | Payer: Medicaid Other | Source: Ambulatory Visit | Attending: Family Medicine | Admitting: Family Medicine

## 2022-08-11 DIAGNOSIS — Z122 Encounter for screening for malignant neoplasm of respiratory organs: Secondary | ICD-10-CM

## 2022-08-11 DIAGNOSIS — F17210 Nicotine dependence, cigarettes, uncomplicated: Secondary | ICD-10-CM

## 2022-08-13 ENCOUNTER — Other Ambulatory Visit: Payer: Self-pay | Admitting: Emergency Medicine

## 2022-08-13 DIAGNOSIS — E041 Nontoxic single thyroid nodule: Secondary | ICD-10-CM

## 2022-09-02 ENCOUNTER — Other Ambulatory Visit: Payer: Self-pay | Admitting: Endocrinology

## 2022-09-02 DIAGNOSIS — E041 Nontoxic single thyroid nodule: Secondary | ICD-10-CM

## 2022-09-22 ENCOUNTER — Ambulatory Visit
Admission: RE | Admit: 2022-09-22 | Discharge: 2022-09-22 | Disposition: A | Discharge status: Home / Self Care | Payer: Medicaid Other | Source: Ambulatory Visit | Attending: Endocrinology | Admitting: Endocrinology

## 2022-09-22 ENCOUNTER — Other Ambulatory Visit: Payer: Self-pay | Admitting: Emergency Medicine

## 2022-09-22 DIAGNOSIS — E041 Nontoxic single thyroid nodule: Secondary | ICD-10-CM

## 2022-09-23 ENCOUNTER — Other Ambulatory Visit (HOSPITAL_COMMUNITY): Payer: Self-pay | Admitting: Emergency Medicine

## 2022-09-23 DIAGNOSIS — E041 Nontoxic single thyroid nodule: Secondary | ICD-10-CM

## 2023-03-03 ENCOUNTER — Telehealth: Payer: Self-pay

## 2023-03-03 NOTE — Telephone Encounter (Signed)
Patient called and left message on voicemail to schedule a pap smear per scheduler. Attempted to return call, left message on voicemail requesting a return call.

## 2023-04-09 ENCOUNTER — Other Ambulatory Visit (HOSPITAL_COMMUNITY)
Admission: RE | Admit: 2023-04-09 | Discharge: 2023-04-09 | Disposition: A | Discharge status: Home / Self Care | Payer: MEDICAID | Source: Ambulatory Visit | Attending: Hematology and Oncology | Admitting: Hematology and Oncology

## 2023-04-09 ENCOUNTER — Other Ambulatory Visit: Payer: Medicaid Other | Admitting: Hematology and Oncology

## 2023-04-09 ENCOUNTER — Other Ambulatory Visit: Payer: Self-pay

## 2023-04-09 DIAGNOSIS — Z124 Encounter for screening for malignant neoplasm of cervix: Secondary | ICD-10-CM

## 2023-04-09 NOTE — Progress Notes (Signed)
Patient: Michelle Nielsen           Date of Birth: [birth year]           MRN: [id number] Visit Date: 04/09/2023 PCP: Alger Memos, FNP     Cervical Exam Pap smear completed: Pap test Abnormal Observations: Normal Recommendations: 12/31/2016 - ASCUS/ HPV+ 03/26/2022 - Negative/ HPV- Will repeat in one year if abnormal.       Patient's History Patient Active Problem List   Diagnosis Date Noted   Screening breast examination 03/21/2019   Well woman exam with routine gynecological exam 11/01/2018   Vaginal discharge 11/01/2018   Left serous otitis media 08/19/2018   Dental caries 06/21/2018   Depression 06/21/2018   Dark urine 06/21/2018   Decreased hearing of both ears 02/03/2018   Positive fecal immunochemical test 11/01/2017   Prediabetes 11/01/2017   Mixed hyperlipidemia 11/01/2017   Moderate dysplasia of cervix (CIN II) 10/28/2015   ASCUS with positive high risk HPV 10/14/2015   UTI 07/19/2009   ESSENTIAL HYPERTENSION 07/02/2009   UMBILICAL HERNIA 02/21/2009   MENOPAUSE-RELATED VASOMOTOR SYMPTOMS, HOT FLASHES 02/21/2009   BAKER'S CYST, RIGHT KNEE 02/21/2009   ANXIETY 07/01/2006   TOBACCO DEPENDENCE 07/01/2006   Past Medical History:  Diagnosis Date   Abnormal mammogram of right breast    having biopsy 10/29/15   Hearing loss    Vaginal Pap smear, abnormal     Family History  Problem Relation Age of Onset   Heart disease Father    Colon polyps Sister    Colon cancer Maternal Aunt    Esophageal cancer Neg Hx    Rectal cancer Neg Hx    Stomach cancer Neg Hx    Breast cancer Neg Hx     Social History   Occupational History   Not on file  Tobacco Use   Smoking status: Every Day    Current packs/day: 1.00    Average packs/day: 1 pack/day for 42.0 years (42.0 ttl pk-yrs)    Types: Cigarettes   Smokeless tobacco: Never  Vaping Use   Vaping status: Never Used  Substance and Sexual Activity   Alcohol use: Not Currently    Comment: socially   Drug  use: No   Sexual activity: Yes    Partners: Male    Birth control/protection: Post-menopausal

## 2023-04-13 LAB — CYTOLOGY - PAP
Adequacy: ABSENT
Comment: NEGATIVE
Comment: NEGATIVE
Comment: NEGATIVE
Diagnosis: UNDETERMINED — AB
HPV 16: NEGATIVE
HPV 18 / 45: NEGATIVE
High risk HPV: POSITIVE — AB

## 2023-04-14 ENCOUNTER — Telehealth: Payer: Self-pay

## 2023-04-14 NOTE — Telephone Encounter (Signed)
Left message about lab results for patient. Left name and number for patient to call back.

## 2023-05-07 ENCOUNTER — Other Ambulatory Visit (HOSPITAL_COMMUNITY)
Admission: RE | Admit: 2023-05-07 | Discharge: 2023-05-07 | Disposition: A | Discharge status: Home / Self Care | Payer: MEDICAID | Source: Ambulatory Visit | Attending: Obstetrics and Gynecology | Admitting: Obstetrics and Gynecology

## 2023-05-07 ENCOUNTER — Ambulatory Visit: Payer: MEDICAID | Admitting: Hematology and Oncology

## 2023-05-07 VITALS — BP 155/100 | Wt 139.6 lb

## 2023-05-07 DIAGNOSIS — R87810 Cervical high risk human papillomavirus (HPV) DNA test positive: Secondary | ICD-10-CM

## 2023-05-07 DIAGNOSIS — R87610 Atypical squamous cells of undetermined significance on cytologic smear of cervix (ASC-US): Secondary | ICD-10-CM | POA: Insufficient documentation

## 2023-05-07 NOTE — Progress Notes (Signed)
 [facility] Community Outreach   GYNECOLOGY CLINIC COLPOSCOPY PROCEDURE NOTE  [name] is a 63 y.o. [id number] here for colposcopy for ASCUS with POSITIVE high risk HPV pap smear on 04/09/2023. Discussed role for HPV in cervical dysplasia, need for surveillance.  Patient given informed consent, signed copy in the chart, time out was performed.  Placed in lithotomy position. Cervix viewed with speculum and colposcope after application of acetic acid.   Colposcopy adequate? Yes  no visible lesions.  ECC specimen obtained. All specimens were labelled and sent to pathology.  Patient was given post procedure instructions.  Will follow up pathology and manage accordingly.  Routine preventative health maintenance measures emphasized.   Harl Setter A, NP 05/07/2023 10:07 AM

## 2023-05-11 ENCOUNTER — Telehealth: Payer: Self-pay

## 2023-05-11 LAB — SURGICAL PATHOLOGY

## 2023-05-11 NOTE — Telephone Encounter (Signed)
Left message for patient about lab results. Left name and number for patient to call back. 

## 2023-05-17 ENCOUNTER — Telehealth: Payer: Self-pay

## 2023-05-17 NOTE — Telephone Encounter (Signed)
 Spoke with patient about colpo results. Explained to patient that her bx showed LSIL. No other FU is recommended at this time but we will repeat her pap smear in 1 year. Patient voiced understanding.

## 2023-11-02 ENCOUNTER — Telehealth: Payer: Self-pay

## 2023-11-02 NOTE — Telephone Encounter (Addendum)
 Patient called requesting cervical screening appointment. Patient has private insurance. Informed patient Michelle Nielsen will return her telephone call.  Patient called to schedule cervical screening appointment with private insurance. Informed patient we still cannot schedule patient's with private insurance. 11/22/2023

## 2023-11-24 NOTE — Telephone Encounter (Signed)
 I have called pt back regarding her desire to schedule a PAP appt. I have shared with the pt that [facility] is only for patients who have no health insurance and because she now has insurance, she will need to use her insurance and see a provider that accepts it. Pt asked for a referral and was advised that she can be seen in GYN and is welcome to schedule an appt at the [facility] GYN department. I have provided her with their contact number.  Pt was also concerned about her records being visible for other providers. I shared with the pt that all her records are electronic and visible. I further advised that if she chooses a provider who is not able to view her electronic records, we can forward them to that medical office. Pt was pleased with this information and stated she will contact [facility] to schedule her PAP. No further assistance was needed at this time.

## 2023-11-24 NOTE — Telephone Encounter (Signed)
 Thank you :)

## 2024-02-29 ENCOUNTER — Other Ambulatory Visit: Payer: Self-pay

## 2024-02-29 DIAGNOSIS — Z1231 Encounter for screening mammogram for malignant neoplasm of breast: Secondary | ICD-10-CM

## 2024-03-09 ENCOUNTER — Ambulatory Visit
Admission: RE | Admit: 2024-03-09 | Discharge: 2024-03-09 | Disposition: A | Discharge status: Home / Self Care | Payer: MEDICAID | Source: Ambulatory Visit

## 2024-03-09 DIAGNOSIS — Z1231 Encounter for screening mammogram for malignant neoplasm of breast: Secondary | ICD-10-CM

## 2024-04-25 ENCOUNTER — Other Ambulatory Visit: Payer: Self-pay

## 2024-04-25 ENCOUNTER — Ambulatory Visit (INDEPENDENT_AMBULATORY_CARE_PROVIDER_SITE_OTHER): Payer: MEDICAID | Admitting: Student

## 2024-04-25 ENCOUNTER — Other Ambulatory Visit (HOSPITAL_COMMUNITY)
Admission: RE | Admit: 2024-04-25 | Discharge: 2024-04-25 | Disposition: A | Source: Ambulatory Visit | Attending: Student | Admitting: Student

## 2024-04-25 ENCOUNTER — Encounter: Payer: Self-pay | Admitting: Student

## 2024-04-25 VITALS — BP 148/96 | HR 86 | Wt 145.6 lb

## 2024-04-25 DIAGNOSIS — Z113 Encounter for screening for infections with a predominantly sexual mode of transmission: Secondary | ICD-10-CM | POA: Insufficient documentation

## 2024-04-25 DIAGNOSIS — Z8742 Personal history of other diseases of the female genital tract: Secondary | ICD-10-CM | POA: Insufficient documentation

## 2024-04-25 DIAGNOSIS — Z1151 Encounter for screening for human papillomavirus (HPV): Secondary | ICD-10-CM | POA: Diagnosis not present

## 2024-04-25 DIAGNOSIS — Z01419 Encounter for gynecological examination (general) (routine) without abnormal findings: Secondary | ICD-10-CM | POA: Diagnosis present

## 2024-04-25 DIAGNOSIS — Z124 Encounter for screening for malignant neoplasm of cervix: Secondary | ICD-10-CM | POA: Diagnosis not present

## 2024-04-25 DIAGNOSIS — R8290 Unspecified abnormal findings in urine: Secondary | ICD-10-CM | POA: Diagnosis not present

## 2024-04-25 DIAGNOSIS — N958 Other specified menopausal and perimenopausal disorders: Secondary | ICD-10-CM | POA: Diagnosis not present

## 2024-04-25 LAB — POCT URINALYSIS DIP (DEVICE)
Bilirubin Urine: NEGATIVE
Glucose, UA: NEGATIVE mg/dL
Ketones, ur: NEGATIVE mg/dL
Leukocytes,Ua: NEGATIVE
Nitrite: NEGATIVE
Protein, ur: NEGATIVE mg/dL
Specific Gravity, Urine: 1.025 (ref 1.005–1.030)
Urobilinogen, UA: 0.2 mg/dL (ref 0.0–1.0)
pH: 7 (ref 5.0–8.0)

## 2024-04-25 MED ORDER — PREMARIN 0.625 MG/GM VA CREA: TOPICAL_CREAM | VAGINAL | 7 refills | Status: AC

## 2024-04-25 NOTE — Progress Notes (Signed)
 "  ANNUAL EXAM Patient name: Michelle Nielsen MRN [id number]  Date of birth: [birth year] Chief Complaint:   Gynecologic Exam  History of Present Illness:   Michelle Nielsen is a 63 y.o. [id number] Caucasian female being seen today for a routine annual exam.  Current complaints: reports concern for UTI. States she gets them recurrently & doesn't feel right down there. Denies vaginal discharge, vaginal bleeding, abdominal/pelvic pain. Has not been sexually active in about 6 months. Would like STI screening today.   No LMP recorded. Patient is postmenopausal.    Last pap 04/2023. Results were: ASCUS w/ HRHPV positive: type not specified. H/O abnormal pap: no. Colposcopy 05/2023 - LSIL Last mammogram: 03/2024. Results were: normal. Family h/o breast cancer: no      07/11/2019    4:11 PM 02/27/2019    1:37 PM 08/19/2018    2:13 PM 06/20/2018    3:15 PM 02/03/2018    3:40 PM  Depression screen PHQ 2/9  Decreased Interest 0 0 0 2 0  Down, Depressed, Hopeless 0 0 0 3 1  PHQ - 2 Score 0 0 0 5 1  Altered sleeping 1 0 0 1   Tired, decreased energy 0 0 0 1   Change in appetite 0 0 0 0   Feeling bad or failure about yourself  1 1 0 3   Trouble concentrating  0 0 1   Moving slowly or fidgety/restless 0 0 0    Suicidal thoughts 0 0 0 0   PHQ-9 Score 2  1  0  11    Difficult doing work/chores Not difficult at all Somewhat difficult Not difficult at all Very difficult      Data saved with a previous flowsheet row definition         No data to display           Review of Systems:   Pertinent items are noted in HPI Denies any headaches, blurred vision, fatigue, shortness of breath, chest pain, abdominal pain, abnormal vaginal discharge/itching/odor/irritation, problems with periods, bowel movements, urination, or intercourse unless otherwise stated above. Pertinent History Reviewed:  Reviewed past medical,surgical, social and family history.  Reviewed problem list, medications and  allergies. Physical Assessment:   Vitals:   04/25/24 1046  BP: (!) 148/96  Pulse: 86  Weight: 145 lb 9.6 oz (66 kg)  Body mass index is 24.23 kg/m.        Physical Examination:   General appearance - well appearing, and in no distress  Mental status - alert, oriented to person, place, and time  Psych:  She has a normal mood and affect  Skin - warm and dry, normal color, no suspicious lesions noted  Chest - effort normal, all lung fields clear to auscultation bilaterally  Heart - normal rate and regular rhythm  Neck:  midline trachea, no thyromegaly or nodules  Breasts - breasts appear normal, no suspicious masses, no skin or nipple changes or  axillary nodes  Abdomen - soft, nontender, nondistended, no masses or organomegaly  Pelvic - VULVA: normal appearing vulva with no masses, tenderness or lesions  VAGINA: normal appearing vagina with normal color and discharge, no lesions  CERVIX: normal appearing cervix without discharge or lesions, no CMT  Thin prep pap is done with HR HPV cotesting  UTERUS: uterus is felt to be normal size, shape, consistency and nontender   ADNEXA: No adnexal masses or tenderness noted.  Rectal - normal rectal, good sphincter tone, no masses  felt.   Extremities:  No swelling or varicosities noted  Chaperone present for exam  Results for orders placed or performed in visit on 04/25/24 (from the past 24 hours)  POCT urinalysis dip (device)   Collection Time: 04/25/24 10:48 AM  Result Value Ref Range   Glucose, UA NEGATIVE NEGATIVE mg/dL   Bilirubin Urine NEGATIVE NEGATIVE   Ketones, ur NEGATIVE NEGATIVE mg/dL   Specific Gravity, Urine 1.025 1.005 - 1.030   Hgb urine dipstick TRACE (A) NEGATIVE   pH 7.0 5.0 - 8.0   Protein, ur NEGATIVE NEGATIVE mg/dL   Urobilinogen, UA 0.2 0.0 - 1.0 mg/dL   Nitrite NEGATIVE NEGATIVE   Leukocytes,Ua NEGATIVE NEGATIVE    Assessment & Plan:  1) Well-Woman Exam  2) Hx abnormal pap smear -F/u dependent on  results  3) STI screening -GC, CT, & trichomonas added to cytology collection. HIV, RPR, HCV, & HBV ordered.   4) GSM -Discussed lack of estrogen can lead to vaginal dryness/irritation & increase incidence of UTI. Discussed use of vaginal estrogen which she would like to start. Rx sent.   Labs/procedures today: urine culture, pap smear, STI screening  Mammogram: in 1 year, or sooner if problems   Orders Placed This Encounter  Procedures   Urine Culture   HIV Antibody (routine testing w rflx)   RPR W/RFLX TO RPR TITER, TREPONEMAL AB, SCREEN AND DIAGNOSIS   Hepatitis B surface antigen   HCV Ab w Reflex to Quant PCR   POCT urinalysis dip (device)    Meds: No orders of the defined types were placed in this encounter.   Follow-up: No follow-ups on file.  Rocky Satterfield, NP 04/25/2024 12:23 PM  "

## 2024-04-26 LAB — HCV INTERPRETATION

## 2024-04-26 LAB — HIV ANTIBODY (ROUTINE TESTING W REFLEX): HIV Screen 4th Generation wRfx: NONREACTIVE

## 2024-04-26 LAB — HCV AB W REFLEX TO QUANT PCR: HCV Ab: NONREACTIVE

## 2024-04-26 LAB — SYPHILIS: RPR W/REFLEX TO RPR TITER AND TREPONEMAL ANTIBODIES, TRADITIONAL SCREENING AND DIAGNOSIS ALGORITHM: RPR Ser Ql: NONREACTIVE

## 2024-04-26 LAB — HEPATITIS B SURFACE ANTIGEN: Hepatitis B Surface Ag: NEGATIVE

## 2024-05-01 ENCOUNTER — Telehealth: Payer: Self-pay | Admitting: Family Medicine

## 2024-05-01 LAB — CYTOLOGY - PAP
Adequacy: ABSENT
Chlamydia: NEGATIVE
Comment: NEGATIVE
Comment: NEGATIVE
Comment: NEGATIVE
Comment: NORMAL
Diagnosis: NEGATIVE
High risk HPV: NEGATIVE
Neisseria Gonorrhea: NEGATIVE
Trichomonas: NEGATIVE

## 2024-05-01 NOTE — Telephone Encounter (Signed)
 Patient stated she was in the office for a UTI testing. She was called in something, bur states it did not work because she feels really bad. She is requesting a call back today.

## 2024-05-03 ENCOUNTER — Telehealth: Payer: Self-pay | Admitting: Family Medicine

## 2024-05-03 NOTE — Telephone Encounter (Signed)
 Pt stated that she was prescribed a antibiotic is not effective for her UTI.  I advised pt that we don't have a urine cx and due to the holidays I would recommend that she goes to her nearest Urgent Care.  Pt verbalized understanding with no further questions.   Ketzaly Cardella, RN  05/03/24

## 2024-05-03 NOTE — Telephone Encounter (Signed)
 Patient called to say she still has not heard from the nurse. She is requesting a call back today.
